# Patient Record
Sex: Female | Born: 1975
Health system: Southern US, Community
[De-identification: ages and names within clinical notes are randomized; demographics above are authoritative.]

---

## 2005-10-08 ENCOUNTER — Ambulatory Visit: Payer: Self-pay | Admitting: Internal Medicine

## 2010-09-08 ENCOUNTER — Ambulatory Visit (INDEPENDENT_AMBULATORY_CARE_PROVIDER_SITE_OTHER): Payer: 59 | Admitting: Family Medicine

## 2010-09-08 ENCOUNTER — Encounter: Payer: Self-pay | Admitting: Family Medicine

## 2010-09-08 VITALS — BP 110/90 | HR 80 | Temp 98.0°F | Resp 12 | Ht 69.75 in | Wt 207.0 lb

## 2010-09-08 DIAGNOSIS — M545 Low back pain: Secondary | ICD-10-CM

## 2010-09-08 MED ORDER — ACETAMINOPHEN-CODEINE 300-30 MG PO TABS: 1.0000 | ORAL_TABLET | ORAL | Status: AC | PRN

## 2010-09-08 MED ORDER — NAPROXEN 500 MG PO TABS: 500.0000 mg | ORAL_TABLET | Freq: Two times a day (BID) | ORAL | Status: DC

## 2010-09-08 MED ORDER — ACETAMINOPHEN-CODEINE 300-30 MG PO TABS: 1.0000 | ORAL_TABLET | ORAL | Status: DC | PRN

## 2010-09-08 NOTE — Patient Instructions (Signed)
Lumbar Radiculopathy, Sciatica Sciatica is a weakness and/or changes in sensation (tingling, jolts, hot and cold, numbness) along the path the sciatic nerve travels. Irritation or damage to lumbar nerve roots is often also referred to as lumbar radiculopathy.  Lumbar radiculopathy (Sciatica) is the most common form of this problem. Radiculopathy can occur in any of the nerves coming out of the spinal cord. The problems caused depend on which nerves are involved. The sciatic nerve is the large nerve supplying the branches of nerves going from the hip to the toes. It often causes a numbness or weakness in the skin and/or muscles that the sciatic nerve serves. It also may cause symptoms (problems) of pain, burning, tingling, or electric shock-like feelings in the path of this nerve. This usually comes from injury to the fibers that make up the sciatic nerve. Some of these symptoms are low back pain and/or unpleasant feelings in the following areas:  From the mid-buttock down the back of the leg to the back of the knee.   And/or the outside of the calf and top of the foot.   And/or behind the inner ankle to the sole of the foot.  CAUSES  Herniated or slipped disc. Discs are the little cushions between the bones in the back.   Pressure by the piriformis muscle in the buttock on the sciatic nerve (Piriformis Syndrome).   Misalignment of the bones in the lower back and buttocks (Sacroiliac Joint Derangement).   Narrowing of the spinal canal that puts pressure on or pinches the fibers that make up the sciatic nerve.   A slipped vertebra that is out of line with those above or beneath it.   Abnormality of the nervous system itself so that nerve fibers do not transmit signals properly, especially to feet and calves (neuropathy).   Tumor (this is rare).  Your caregiver can usually determine the cause of your sciatica and begin the treatment most likely to help you. TREATMENT Taking over-the-counter  painkillers, physical therapy, rest, exercise, spinal manipulation, and injections of anesthetics and/or steroids may be used. Surgery, acupuncture, and Yoga can also be effective. Mind over matter techniques, mental imagery, and changing factors such as your bed, chair, desk height, posture, and activities are other treatments that may be helpful. You and your caregiver can help determine what is best for you. With proper diagnosis, the cause of most sciatica can be identified and removed. Communication and cooperation between your caregiver and you is essential. If you are not successful immediately, do not be discouraged. With time, a proper treatment can be found that will make you comfortable. HOME CARE INSTRUCTIONS  If the pain is coming from a problem in the back, applying ice to that area for 15 minutes, 2-3 times per day while awake, may be helpful. Put the ice in a plastic bag. Place a towel between the bag of ice and your skin.   You may exercise or perform your usual activities if these do not aggravate your pain, or as suggested by your caregiver.   Only take over-the-counter or prescription medicines for pain, discomfort, or fever as directed by your caregiver.   If your caregiver has given you a follow-up appointment, it is very important to keep that appointment. Not keeping the appointment could result in a chronic or permanent injury, pain, and disability. If there is any problem keeping the appointment, you must call back to this facility for assistance.  SEEK IMMEDIATE MEDICAL CARE IF:  You experience loss  of control of bowel or bladder.   You have increasing weakness in the trunk, buttocks, or legs.   There is numbness in any areas from the hip down to the toes.   You have difficulty walking or keeping your balance.   You have any of the above, with fever or forceful vomiting.  Document Released: 05/04/2001 Document Re-Released: 06/01/2009 [facility] Patient Information  2011 [place], [place].

## 2010-09-08 NOTE — Progress Notes (Signed)
  Subjective:    Patient ID: Marie Barr, female    DOB: 1976-03-28, 35 y.o.   MRN: [id number]  HPI Patient is new to establish care. She has unremarkable past history. No known drug allergies.  Takes no regular medications. She is seen with acute issue 2 week history of low back pain. Start mostly in the left buttock and radiates posterior lateral left lower tibia to the knee. Sharp pain 9/10 severity at it's worst. Worse with changing position and sitting. Relieved with standing and sometimes supine. No numbness or weakness. No incontinence symptoms.  Pain progressive over 2 weeks.  Some improvement with Advil. No prior history of back difficulties.   Review of Systems  Constitutional: Negative for fever, chills, fatigue and unexpected weight change.  Gastrointestinal: Negative for nausea, vomiting, abdominal pain, diarrhea, constipation and blood in stool.  Genitourinary: Negative for dysuria and hematuria.  Musculoskeletal: Positive for back pain. Negative for myalgias, joint swelling and gait problem.  Skin: Negative for rash.       Objective:   Physical Exam  Constitutional: She appears well-developed and well-nourished.  Cardiovascular: Normal rate, regular rhythm and normal heart sounds.   No murmur heard. Pulmonary/Chest: Effort normal and breath sounds normal. She has no wheezes. She has no rales.  Musculoskeletal: She exhibits no edema.       Lower lumbar spine nontender. Straight leg raising negative  Neurological:       Patient's full-strength with plantar flexion dorsiflexion bilaterally. DP reflexes 2+ knee bilaterally. She has full ankle reflex on the right trace on the left. Normal symmetric function touch          Assessment & Plan:  Low back pain with radiculopathy symptoms on the left. Trace diminished reflex left compared to right. Naproxen 500 mg twice a day with food and Tylenol with Codeine when necessary for severe pain. She has not tolerated hydrocodone  or oxycodone the past. Touch base 2 weeks if no better. May need MRI to further

## 2010-09-09 ENCOUNTER — Other Ambulatory Visit: Payer: Self-pay | Admitting: *Deleted

## 2010-09-09 ENCOUNTER — Telehealth: Payer: Self-pay | Admitting: Family Medicine

## 2010-09-09 DIAGNOSIS — M545 Low back pain: Secondary | ICD-10-CM

## 2010-09-09 MED ORDER — NAPROXEN 500 MG PO TABS: 500.0000 mg | ORAL_TABLET | Freq: Two times a day (BID) | ORAL | Status: AC

## 2010-09-09 NOTE — Telephone Encounter (Signed)
Pt called and said that the script for Naproxen was never rcvd by [facility] on [address]. Pls resend asap today.

## 2010-10-21 ENCOUNTER — Ambulatory Visit (INDEPENDENT_AMBULATORY_CARE_PROVIDER_SITE_OTHER): Payer: 59 | Admitting: Family Medicine

## 2010-10-21 ENCOUNTER — Encounter: Payer: Self-pay | Admitting: Family Medicine

## 2010-10-21 VITALS — BP 110/74 | Temp 98.4°F | Wt 206.0 lb

## 2010-10-21 DIAGNOSIS — M545 Low back pain: Secondary | ICD-10-CM

## 2010-10-21 NOTE — Progress Notes (Addendum)
  Subjective:    Patient ID: Marie Barr, female    DOB: [birth year], 35 y.o.   MRN: [id number]  HPI Followup low back pain with left sciatica symptoms. Symptoms not improved and possibly worse. Pain now radiating occasionally all the way to mid calf. No numbness. No weakness. No reported injury. Has been to chiropractor since seen here and treated couple of times without improvement. She has taken Naprosyn without relief. No incontinence symptoms. Pain still remains severe at times. Worse with position change   Review of Systems  Constitutional: Negative for fever, chills, appetite change, fatigue and unexpected weight change.  Genitourinary: Negative for dysuria.  Musculoskeletal: Positive for back pain. Negative for gait problem.  Hematological: Negative for adenopathy. Does not bruise/bleed easily.       Objective:   Physical Exam  Constitutional: She appears well-developed and well-nourished.  Cardiovascular: Normal rate, regular rhythm and normal heart sounds.   Pulmonary/Chest: Effort normal and breath sounds normal. No respiratory distress. She has no wheezes. She has no rales.  Musculoskeletal:       Straight leg raise positive on the left. No left lower extremity edema  Neurological:       Full strength with plantar flexion dorsi flexion. Sensory intact. Diminished deep tendon reflex left ankle compared to right and slightly diminished left knee compared to right          Assessment & Plan:  Lumbar back pain with left lower extremity radiculopathy symptoms and diminished reflexes as above. Start with lumbosacral spine films. Will likely need MRI to further assess given progressive symptoms over 6 weeks not responding to conservative therapy  Abnormal MRI-see result note.  Refer neurosurgery.

## 2010-10-22 ENCOUNTER — Ambulatory Visit (INDEPENDENT_AMBULATORY_CARE_PROVIDER_SITE_OTHER)
Admission: RE | Admit: 2010-10-22 | Discharge: 2010-10-22 | Disposition: A | Discharge status: Home / Self Care | Payer: 59 | Source: Ambulatory Visit | Attending: Family Medicine | Admitting: Family Medicine

## 2010-10-22 DIAGNOSIS — M545 Low back pain: Secondary | ICD-10-CM

## 2010-10-23 ENCOUNTER — Telehealth: Payer: Self-pay | Admitting: *Deleted

## 2010-10-23 DIAGNOSIS — M545 Low back pain, unspecified: Secondary | ICD-10-CM

## 2010-10-23 NOTE — Telephone Encounter (Signed)
MRI lumbar spine order completed

## 2010-10-29 ENCOUNTER — Ambulatory Visit
Admission: RE | Admit: 2010-10-29 | Discharge: 2010-10-29 | Disposition: A | Discharge status: Home / Self Care | Payer: 59 | Source: Ambulatory Visit | Attending: Family Medicine | Admitting: Family Medicine

## 2010-10-29 DIAGNOSIS — M545 Low back pain: Secondary | ICD-10-CM

## 2010-10-29 NOTE — Progress Notes (Signed)
Addended by: Kristian Covey on: 10/29/2010 05:35 PM   Modules accepted: Orders

## 2010-10-29 NOTE — Progress Notes (Deleted)
  Subjective:    Patient ID: Marie Barr, female    DOB: 02/26/1976, 35 y.o.   MRN: [id number]  HPI    Review of Systems     Objective:   Physical Exam        Assessment & Plan:

## 2010-10-29 NOTE — Progress Notes (Signed)
Quick Note:  Have you seen this yet? ______

## 2010-11-04 ENCOUNTER — Other Ambulatory Visit (INDEPENDENT_AMBULATORY_CARE_PROVIDER_SITE_OTHER): Payer: 59

## 2010-11-04 DIAGNOSIS — Z0000 Encounter for general adult medical examination without abnormal findings: Secondary | ICD-10-CM

## 2010-11-04 LAB — BASIC METABOLIC PANEL
CO2: 28 mEq/L (ref 19–32)
Calcium: 9.2 mg/dL (ref 8.4–10.5)
Chloride: 106 mEq/L (ref 96–112)
Glucose, Bld: 97 mg/dL (ref 70–99)
Potassium: 4.8 mEq/L (ref 3.5–5.1)
Sodium: 142 mEq/L (ref 135–145)

## 2010-11-04 LAB — CBC WITH DIFFERENTIAL/PLATELET
Basophils Relative: 0.6 % (ref 0.0–3.0)
Eosinophils Absolute: 0.4 10*3/uL (ref 0.0–0.7)
HCT: 39 % (ref 36.0–46.0)
Hemoglobin: 13.4 g/dL (ref 12.0–15.0)
Lymphocytes Relative: 22.9 % (ref 12.0–46.0)
Lymphs Abs: 1.7 10*3/uL (ref 0.7–4.0)
MCHC: 34.2 g/dL (ref 30.0–36.0)
Neutro Abs: 4.4 10*3/uL (ref 1.4–7.7)
RBC: 4.38 Mil/uL (ref 3.87–5.11)

## 2010-11-04 LAB — LIPID PANEL
Cholesterol: 178 mg/dL (ref 0–200)
HDL: 33.3 mg/dL — ABNORMAL LOW (ref >39.00)

## 2010-11-04 LAB — HEPATIC FUNCTION PANEL
AST: 20 U/L (ref 0–37)
Albumin: 4.3 g/dL (ref 3.5–5.2)
Alkaline Phosphatase: 74 U/L (ref 39–117)
Total Protein: 7.1 g/dL (ref 6.0–8.3)

## 2010-11-04 LAB — TSH: TSH: 1.44 u[IU]/mL (ref 0.35–5.50)

## 2010-11-04 LAB — POCT URINALYSIS DIPSTICK
Bilirubin, UA: NEGATIVE
Ketones, UA: NEGATIVE
Leukocytes, UA: NEGATIVE
Spec Grav, UA: 1.02

## 2010-11-16 HISTORY — PX: SPINE SURGERY: SHX786

## 2010-11-17 ENCOUNTER — Encounter: Payer: 59 | Admitting: Family Medicine

## 2011-12-29 ENCOUNTER — Other Ambulatory Visit (INDEPENDENT_AMBULATORY_CARE_PROVIDER_SITE_OTHER): Payer: 59

## 2011-12-29 DIAGNOSIS — Z0000 Encounter for general adult medical examination without abnormal findings: Secondary | ICD-10-CM

## 2011-12-29 LAB — POCT URINALYSIS DIPSTICK
Bilirubin, UA: NEGATIVE
Ketones, UA: NEGATIVE
Nitrite, UA: NEGATIVE
Protein, UA: NEGATIVE
Urobilinogen, UA: 0.2

## 2011-12-29 LAB — CBC WITH DIFFERENTIAL/PLATELET
Basophils Relative: 0.4 % (ref 0.0–3.0)
Eosinophils Relative: 2.7 % (ref 0.0–5.0)
HCT: 39 % (ref 36.0–46.0)
Hemoglobin: 13.1 g/dL (ref 12.0–15.0)
Lymphs Abs: 1.7 10*3/uL (ref 0.7–4.0)
MCV: 87.5 fl (ref 78.0–100.0)
Monocytes Absolute: 0.6 10*3/uL (ref 0.1–1.0)
Neutro Abs: 5.1 10*3/uL (ref 1.4–7.7)
Neutrophils Relative %: 67.2 % (ref 43.0–77.0)
RBC: 4.45 Mil/uL (ref 3.87–5.11)
WBC: 7.6 10*3/uL (ref 4.5–10.5)

## 2011-12-29 LAB — HEPATIC FUNCTION PANEL
ALT: 12 U/L (ref 0–35)
Bilirubin, Direct: 0.1 mg/dL (ref 0.0–0.3)
Total Protein: 6.8 g/dL (ref 6.0–8.3)

## 2011-12-29 LAB — BASIC METABOLIC PANEL
Chloride: 108 mEq/L (ref 96–112)
Potassium: 3.7 mEq/L (ref 3.5–5.1)
Sodium: 139 mEq/L (ref 135–145)

## 2011-12-29 LAB — LIPID PANEL
Total CHOL/HDL Ratio: 5
Triglycerides: 73 mg/dL (ref 0.0–149.0)

## 2012-01-05 ENCOUNTER — Ambulatory Visit (INDEPENDENT_AMBULATORY_CARE_PROVIDER_SITE_OTHER): Payer: 59 | Admitting: Family Medicine

## 2012-01-05 ENCOUNTER — Encounter: Payer: Self-pay | Admitting: Family Medicine

## 2012-01-05 VITALS — BP 120/72 | HR 72 | Temp 97.8°F | Resp 12 | Ht 69.75 in | Wt 209.0 lb

## 2012-01-05 DIAGNOSIS — Z0000 Encounter for general adult medical examination without abnormal findings: Secondary | ICD-10-CM

## 2012-01-05 MED ORDER — ETONOGESTREL-ETHINYL ESTRADIOL 0.12-0.015 MG/24HR VA RING: VAGINAL_RING | VAGINAL | Status: DC

## 2012-01-05 NOTE — Patient Instructions (Addendum)
Check on dates for last tetanus. Reschedule for pap smear.

## 2012-01-05 NOTE — Progress Notes (Signed)
  Subjective:    Patient ID: Marie Barr, female    DOB: 1975-06-19, 36 y.o.   MRN: [id number]  HPI  Patient seen for complete physical. She had back surgery for L5-S1 discectomy last summer and has done extremely well since then. She's recently started exercise program. She does have somewhat irregular menses. They occur monthly but sometimes had prolonged spotting. Previously, she has used Nuva ring with good success would like to consider going back on that. Her periods were more regulated on that.  She is nonsmoker.  Last tetanus she estimates 8 years ago. She is currently on menses and wishes to avoid Pap smear today.  No past medical history on file. Past Surgical History  Procedure Date  . Spine surgery 11-16-10    L5-S1 discetomy    reports that she has never smoked. She does not have any smokeless tobacco history on file. Her alcohol and drug histories not on file. family history includes Arthritis in her mother; Hypertension in her father and mother; and Stroke in her mother. Not on File    Review of Systems  Constitutional: Negative for fever, activity change, appetite change, fatigue and unexpected weight change.  HENT: Negative for hearing loss, ear pain, sore throat and trouble swallowing.   Eyes: Negative for visual disturbance.  Respiratory: Negative for cough and shortness of breath.   Cardiovascular: Negative for chest pain and palpitations.  Gastrointestinal: Negative for abdominal pain, diarrhea, constipation and blood in stool.  Genitourinary: Negative for dysuria and hematuria.  Musculoskeletal: Negative for myalgias, back pain and arthralgias.  Skin: Negative for rash.  Neurological: Negative for dizziness, syncope and headaches.  Hematological: Negative for adenopathy.  Psychiatric/Behavioral: Negative for confusion and dysphoric mood.       Objective:   Physical Exam  Constitutional: She is oriented to person, place, and time. She appears well-developed  and well-nourished.  HENT:  Head: Normocephalic and atraumatic.  Eyes: EOM are normal. Pupils are equal, round, and reactive to light.  Neck: Normal range of motion. Neck supple. No thyromegaly present.  Cardiovascular: Normal rate, regular rhythm and normal heart sounds.   No murmur heard. Pulmonary/Chest: Breath sounds normal. No respiratory distress. She has no wheezes. She has no rales.  Abdominal: Soft. Bowel sounds are normal. She exhibits no distension and no mass. There is no tenderness. There is no rebound and no guarding.  Musculoskeletal: Normal range of motion. She exhibits no edema.  Lymphadenopathy:    She has no cervical adenopathy.  Neurological: She is alert and oriented to person, place, and time. She displays normal reflexes. No cranial nerve deficit.  Skin: No rash noted.  Psychiatric: She has a normal mood and affect. Her behavior is normal. Judgment and thought content normal.          Assessment & Plan:  Complete physical. Labs reviewed with patient. She has low HDL otherwise no significant abnormalities. Check on date of last tetanus. Nuva ring prescribed. She is aware of instructions for use. She will return for Pap smear when not on heavy menses. Work on weight loss.

## 2013-01-05 ENCOUNTER — Other Ambulatory Visit: Payer: Self-pay

## 2013-01-05 MED ORDER — ETONOGESTREL-ETHINYL ESTRADIOL 0.12-0.015 MG/24HR VA RING: VAGINAL_RING | VAGINAL | Status: AC

## 2013-03-01 ENCOUNTER — Other Ambulatory Visit (HOSPITAL_COMMUNITY): Payer: Self-pay | Admitting: Endocrinology

## 2013-03-01 DIAGNOSIS — E0590 Thyrotoxicosis, unspecified without thyrotoxic crisis or storm: Secondary | ICD-10-CM

## 2013-03-20 ENCOUNTER — Encounter (HOSPITAL_COMMUNITY)
Admission: RE | Admit: 2013-03-20 | Discharge: 2013-03-20 | Disposition: A | Discharge status: Home / Self Care | Payer: 59 | Source: Ambulatory Visit | Attending: Endocrinology | Admitting: Endocrinology

## 2013-03-20 DIAGNOSIS — E0590 Thyrotoxicosis, unspecified without thyrotoxic crisis or storm: Secondary | ICD-10-CM | POA: Insufficient documentation

## 2013-03-21 ENCOUNTER — Encounter (HOSPITAL_COMMUNITY)
Admission: RE | Admit: 2013-03-21 | Discharge: 2013-03-21 | Disposition: A | Discharge status: Home / Self Care | Payer: 59 | Source: Ambulatory Visit | Attending: Endocrinology | Admitting: Endocrinology

## 2013-03-21 MED ORDER — SODIUM PERTECHNETATE TC 99M INJECTION
10.7000 | Freq: Once | INTRAVENOUS | Status: AC | PRN
  Administered 2013-03-21: 11 via INTRAVENOUS

## 2013-03-21 MED ORDER — SODIUM IODIDE I 131 CAPSULE
10.0000 | Freq: Once | INTRAVENOUS | Status: AC | PRN
  Administered 2013-03-21: 10 via ORAL

## 2013-03-30 ENCOUNTER — Other Ambulatory Visit: Payer: Self-pay | Admitting: Endocrinology

## 2013-03-30 DIAGNOSIS — E0590 Thyrotoxicosis, unspecified without thyrotoxic crisis or storm: Secondary | ICD-10-CM

## 2013-04-04 ENCOUNTER — Ambulatory Visit
Admission: RE | Admit: 2013-04-04 | Discharge: 2013-04-04 | Disposition: A | Discharge status: Home / Self Care | Payer: 59 | Source: Ambulatory Visit | Attending: Endocrinology | Admitting: Endocrinology

## 2013-04-04 DIAGNOSIS — E0590 Thyrotoxicosis, unspecified without thyrotoxic crisis or storm: Secondary | ICD-10-CM

## 2013-04-06 ENCOUNTER — Other Ambulatory Visit (HOSPITAL_COMMUNITY): Payer: Self-pay | Admitting: Endocrinology

## 2013-04-06 DIAGNOSIS — E0590 Thyrotoxicosis, unspecified without thyrotoxic crisis or storm: Secondary | ICD-10-CM

## 2013-04-20 ENCOUNTER — Encounter (HOSPITAL_COMMUNITY)
Admission: RE | Admit: 2013-04-20 | Discharge: 2013-04-20 | Disposition: A | Discharge status: Home / Self Care | Payer: 59 | Source: Ambulatory Visit | Attending: Endocrinology | Admitting: Endocrinology

## 2013-04-24 ENCOUNTER — Encounter (HOSPITAL_COMMUNITY)
Admission: RE | Admit: 2013-04-24 | Discharge: 2013-04-24 | Disposition: A | Discharge status: Home / Self Care | Payer: 59 | Source: Ambulatory Visit | Attending: Endocrinology | Admitting: Endocrinology

## 2013-04-24 DIAGNOSIS — E0590 Thyrotoxicosis, unspecified without thyrotoxic crisis or storm: Secondary | ICD-10-CM | POA: Insufficient documentation

## 2013-04-24 LAB — HCG, SERUM, QUALITATIVE: Preg, Serum: NEGATIVE

## 2013-04-24 MED ORDER — SODIUM IODIDE I 131 CAPSULE
28.8000 | Freq: Once | INTRAVENOUS | Status: AC | PRN
  Administered 2013-04-24: 28.8 via ORAL

## 2013-08-07 ENCOUNTER — Other Ambulatory Visit (HOSPITAL_COMMUNITY)
Admission: RE | Admit: 2013-08-07 | Discharge: 2013-08-07 | Disposition: A | Discharge status: Home / Self Care | Payer: 59 | Source: Ambulatory Visit | Attending: Family Medicine | Admitting: Family Medicine

## 2013-08-07 ENCOUNTER — Other Ambulatory Visit: Payer: Self-pay | Admitting: Family Medicine

## 2013-08-07 DIAGNOSIS — Z1151 Encounter for screening for human papillomavirus (HPV): Secondary | ICD-10-CM | POA: Insufficient documentation

## 2013-08-07 DIAGNOSIS — Z124 Encounter for screening for malignant neoplasm of cervix: Secondary | ICD-10-CM | POA: Insufficient documentation

## 2016-09-09 ENCOUNTER — Other Ambulatory Visit (HOSPITAL_COMMUNITY)
Admission: RE | Admit: 2016-09-09 | Discharge: 2016-09-09 | Disposition: A | Discharge status: Home / Self Care | Payer: 59 | Source: Ambulatory Visit | Attending: Family Medicine | Admitting: Family Medicine

## 2016-09-09 ENCOUNTER — Other Ambulatory Visit: Payer: Self-pay | Admitting: Family Medicine

## 2016-09-09 DIAGNOSIS — Z01411 Encounter for gynecological examination (general) (routine) with abnormal findings: Secondary | ICD-10-CM | POA: Diagnosis present

## 2016-09-09 DIAGNOSIS — Z1151 Encounter for screening for human papillomavirus (HPV): Secondary | ICD-10-CM | POA: Diagnosis present

## 2016-09-14 LAB — CYTOLOGY - PAP
DIAGNOSIS: NEGATIVE
HPV: NOT DETECTED

## 2017-04-11 ENCOUNTER — Ambulatory Visit (INDEPENDENT_AMBULATORY_CARE_PROVIDER_SITE_OTHER): Payer: 59

## 2017-04-11 ENCOUNTER — Ambulatory Visit (INDEPENDENT_AMBULATORY_CARE_PROVIDER_SITE_OTHER): Payer: 59 | Admitting: Orthopedic Surgery

## 2017-04-11 ENCOUNTER — Encounter (INDEPENDENT_AMBULATORY_CARE_PROVIDER_SITE_OTHER): Payer: Self-pay | Admitting: Orthopedic Surgery

## 2017-04-11 DIAGNOSIS — M25561 Pain in right knee: Secondary | ICD-10-CM

## 2017-04-11 MED ORDER — METHYLPREDNISOLONE ACETATE 40 MG/ML IJ SUSP
40.0000 mg | INTRAMUSCULAR | Status: AC | PRN
  Administered 2017-04-11: 40 mg via INTRA_ARTICULAR

## 2017-04-11 MED ORDER — LIDOCAINE HCL 1 % IJ SOLN
5.0000 mL | INTRAMUSCULAR | Status: AC | PRN
  Administered 2017-04-11: 5 mL

## 2017-04-11 MED ORDER — BUPIVACAINE HCL 0.25 % IJ SOLN
4.0000 mL | INTRAMUSCULAR | Status: AC | PRN
  Administered 2017-04-11: 4 mL via INTRA_ARTICULAR

## 2017-04-11 NOTE — Progress Notes (Signed)
Office Visit Note   Patient: Marie Barr           Date of Birth: 09/22/1975           MRN: [id number] Visit Date: 04/11/2017 Requested by: Carilyn GoodpastureWillard, Jennifer, PA-C [address] [place], [place] [address] PCP: Trey SailorsPa, [facility]  Subjective: Chief Complaint  Patient presents with  . Right Knee - Pain    HPI: Marie DroneBrenda is a 41 year old patient with right knee pain.  She has a long history of right knee pain and it is been swelling some over the past 6 weeks but more over the past 3 weeks.  Reports some weakness and giving way.  Symptoms are worse when she is going up and down stairs.  She has a brace which she wears but it doesn't help much.  She works [employer] which involves a lot of standing.  It's.  Difficult for her to squat.  She has taken some Advil which has given her only marginal relief.  Localizes the pain primarily to the lateral aspect of the knee              ROS: All systems reviewed are negative as they relate to the chief complaint within the history of present illness.  Patient denies  fevers or chills.   Assessment & Plan: Visit Diagnoses:  1. Acute pain of right knee     Plan: Impression is right knee pain with mild effusion lateral joint line tenderness possible degenerative meniscal tear versus early nonunion radiographic osteoarthritis.  Plan aspiration and injection of the knees done today.  Needs MRI right knee to evaluate for lateral meniscal tear.  I'll see her back after that study  Follow-Up Instructions: Return for after MRI.   Orders:  Orders Placed This Encounter  Procedures  . XR KNEE 3 VIEW RIGHT  . MR Knee Right w/o contrast   No orders of the defined types were placed in this encounter.     Procedures: Large Joint Inj: R knee on 04/11/2017 1:52 PM Indications: diagnostic evaluation, joint swelling and pain Details: 18 G 1.5 in needle, superolateral approach  Arthrogram: No  Medications: 5 mL lidocaine 1  %; 40 mg methylPREDNISolone acetate 40 MG/ML; 4 mL bupivacaine 0.25 % Outcome: tolerated well, no immediate complications Procedure, treatment alternatives, risks and benefits explained, specific risks discussed. Consent was given by the patient. Immediately prior to procedure a time out was called to verify the correct patient, procedure, equipment, support staff and site/side marked as required. Patient was prepped and draped in the usual sterile fashion.       Clinical Data: No additional findings.  Objective: Vital Signs: There were no vitals taken for this visit.  Physical Exam:   Constitutional: Patient appears well-developed HEENT:  Head: Normocephalic Eyes:EOM are normal Neck: Normal range of motion Cardiovascular: Normal rate Pulmonary/chest: Effort normal Neurologic: Patient is alert Skin: Skin is warm Psychiatric: Patient has normal mood and affect    Ortho Exam: Orthopedic exam demonstrates full active and passive range of motion of the right knee with trace effusion lateral joint line tenderness stable collateral crucial ligaments intact extensor mechanism palpable pedal pulses no other groin pain with internal/external rotation of the leg.  She does have some focal lateral joint line tenderness but no actual patellar tendon tenderness.  Patella itself is stable with no apprehension  Specialty Comments:  No specialty comments available.  Imaging: Xr Knee 3 View Right  Result Date: 04/11/2017  AP lateral merchant right knee reviewed.  There is early spurring in the lateral compartment as well as on the proximal and distal aspect of the patellar tendon.  No effusion is present.  No fractures present.    PMFS History: There are no active problems to display for this patient.  No past medical history on file.  Family History  Problem Relation Age of Onset  . Arthritis Mother   . Hypertension Mother   . Stroke Mother        two  . Hypertension Father       Past Surgical History:  Procedure Laterality Date  . SPINE SURGERY  11-16-10   L5-S1 discetomy   Social History   Occupational History  . Not on file  Tobacco Use  . Smoking status: Never Smoker  . Smokeless tobacco: Never Used  Substance and Sexual Activity  . Alcohol use: Not on file  . Drug use: Not on file  . Sexual activity: Not on file

## 2017-04-13 ENCOUNTER — Ambulatory Visit (INDEPENDENT_AMBULATORY_CARE_PROVIDER_SITE_OTHER): Payer: Self-pay | Admitting: Orthopedic Surgery

## 2017-06-07 ENCOUNTER — Other Ambulatory Visit: Payer: Self-pay | Admitting: Family Medicine

## 2017-06-07 DIAGNOSIS — Z1231 Encounter for screening mammogram for malignant neoplasm of breast: Secondary | ICD-10-CM

## 2017-06-15 ENCOUNTER — Ambulatory Visit (INDEPENDENT_AMBULATORY_CARE_PROVIDER_SITE_OTHER): Payer: 59

## 2017-06-15 ENCOUNTER — Encounter (INDEPENDENT_AMBULATORY_CARE_PROVIDER_SITE_OTHER): Payer: Self-pay | Admitting: Orthopedic Surgery

## 2017-06-15 ENCOUNTER — Ambulatory Visit (INDEPENDENT_AMBULATORY_CARE_PROVIDER_SITE_OTHER): Payer: 59 | Admitting: Orthopedic Surgery

## 2017-06-15 DIAGNOSIS — M25562 Pain in left knee: Secondary | ICD-10-CM

## 2017-06-15 DIAGNOSIS — M545 Low back pain, unspecified: Secondary | ICD-10-CM

## 2017-06-15 DIAGNOSIS — G8929 Other chronic pain: Secondary | ICD-10-CM | POA: Diagnosis not present

## 2017-06-15 DIAGNOSIS — M25511 Pain in right shoulder: Secondary | ICD-10-CM

## 2017-06-15 MED ORDER — METHOCARBAMOL 500 MG PO TABS: 500.0000 mg | ORAL_TABLET | Freq: Three times a day (TID) | ORAL | 0 refills | Status: AC | PRN

## 2017-06-18 ENCOUNTER — Encounter (INDEPENDENT_AMBULATORY_CARE_PROVIDER_SITE_OTHER): Payer: Self-pay | Admitting: Orthopedic Surgery

## 2017-06-18 DIAGNOSIS — M25562 Pain in left knee: Secondary | ICD-10-CM

## 2017-06-18 DIAGNOSIS — G8929 Other chronic pain: Secondary | ICD-10-CM | POA: Diagnosis not present

## 2017-06-18 MED ORDER — METHYLPREDNISOLONE ACETATE 40 MG/ML IJ SUSP
40.0000 mg | INTRAMUSCULAR | Status: AC | PRN
  Administered 2017-06-18: 40 mg via INTRA_ARTICULAR

## 2017-06-18 MED ORDER — BUPIVACAINE HCL 0.25 % IJ SOLN
4.0000 mL | INTRAMUSCULAR | Status: AC | PRN
  Administered 2017-06-18: 4 mL via INTRA_ARTICULAR

## 2017-06-18 MED ORDER — LIDOCAINE HCL 1 % IJ SOLN
5.0000 mL | INTRAMUSCULAR | Status: AC | PRN
  Administered 2017-06-18: 5 mL

## 2017-06-18 NOTE — Progress Notes (Signed)
Office Visit Note   Patient: Marie Barr           Date of Birth: 22-Dec-1975           MRN: [id number] Visit Date: 06/15/2017 Requested by: [facility] [address] [place], [place] [address] PCP: [facility]  Subjective: Chief Complaint  Patient presents with  . Right Shoulder - Pain  . Lower Back - Pain  . Left Knee - Pain    HPI: Marie Barr is a patient with right shoulder pain left knee pain and low back pain.  In regards to the right shoulder pain she reports significant pain under the shoulder blade.  Has increased pain with repeat motion.  Her pain is in the area of the rhomboid attachment.  Her symptoms are on and off.  When they are on she really has a tough time relieving the pain.  Stretching does not help.  The pain does not radiate down the arm.  She denies any neck pain.  Sleeps on her left hand side.  Patient also reports left knee pain.  She had a successful injection into the right knee.  States that on the left knee going up and down stairs is painful.  She is unable to lay her legs on top of each other the couch.  Patient also reports chronic low back pain.  She is unable to forward flex and bend without pain.  States that the pain localizes within her lower back with no radiation or numbness and tingling.  Cannot really walk in the morning due to stiffness.  She does have a history of lumbar surgery 8 years ago for a ruptured disc.  States this pain is not quite the same as her ruptured disc pain.  She is currently on a prednisone taper which is helping only marginally.              ROS: All systems reviewed are negative as they relate to the chief complaint within the history of present illness.  Patient denies  fevers or chills.   Assessment & Plan: Visit Diagnoses:  1. Chronic midline low back pain without sciatica   2. Chronic right shoulder pain   3. Chronic pain of left knee     Plan: Impression  is right shoulder pain related to the shoulder blade which does not sound like it is referred pain from the neck.  Her shoulder exam is normal.  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  We will get MRI scan of that scapula to evaluate for muscle detachment.  The symptoms in the shoulder have been ongoing for over 6 months.  Regard to the left knee I think this likely represents patellofemoral arthritis.  An injection helped the right knee and we will try that for the left knee.  In regards to the chronic low back pain she has a history of surgery in the lower back.  I think she likely has some type of recurrent central disc based on the amount of disability she is having.  This is been going on now for 2-3 months and it is worsening.  I think she needs MRI scan of the lumbar spine to evaluate for recurrent HNP with subsequent ESI to follow.  We will also add Robaxin as a muscle relaxer to her pain regimen.  Follow-Up Instructions: Return for after MRI.   Orders:  Orders  Placed This Encounter  Procedures  . XR Lumbar Spine 2-3 Views  . XR KNEE 3 VIEW LEFT  . XR Shoulder Right  . MR Lumbar Spine w/o contrast  . MR SHOULDER RIGHT WO CONTRAST   Meds ordered this encounter  Medications  . methocarbamol (ROBAXIN) 500 MG tablet    Sig: Take 1 tablet (500 mg total) by mouth every 8 (eight) hours as needed for muscle spasms.    Dispense:  30 tablet    Refill:  0      Procedures: Large Joint Inj: L knee on 06/18/2017 8:44 AM Indications: diagnostic evaluation, joint swelling and pain Details: 18 G 1.5 in needle, superolateral approach  Arthrogram: No  Medications: 5 mL lidocaine 1 %; 40 mg methylPREDNISolone acetate 40 MG/ML; 4 mL bupivacaine 0.25 % Outcome: tolerated well, no immediate complications Procedure, treatment alternatives, risks and benefits explained, specific risks discussed. Consent was given by the patient.  Immediately prior to procedure a time out was called to verify the correct patient, procedure, equipment, support staff and site/side marked as required. Patient was prepped and draped in the usual sterile fashion.       Clinical Data: No additional findings.  Objective: Vital Signs: There were no vitals taken for this visit.  Physical Exam:   Constitutional: Patient appears well-developed HEENT:  Head: Normocephalic Eyes:EOM are normal Neck: Normal range of motion Cardiovascular: Normal rate Pulmonary/chest: Effort normal Neurologic: Patient is alert Skin: Skin is warm Psychiatric: Patient has normal mood and affect    Ortho Exam: Orthopedic examination of the right shoulder demonstrates full active forward flexion abduction and external rotation.  Rotator cuff strength intact to isolated infraspinatus supraspinatus and subscap muscle testing.  No scapular dyskinesia with forward flexion.  She does have tenderness along the medial border of the scapula.  Negative O'Brien's testing on the right.  No AC joint tenderness on the right.  No other masses lymphadenopathy or skin changes noted in the shoulder girdle region.  Examination of the left knee demonstrates no effusion but patellofemoral crepitus.  Collateral and cruciate ligaments are stable.  Extensor mechanism is intact.  No other masses lymphadenopathy or skin changes noted in the left knee region.  Patient has well-healed surgical incision on her back.  She has pain with forward and lateral bending.  No nerve root tension signs.  Symmetric reflexes bilateral patella and Achilles.  No muscle atrophy in the legs.  No definite paresthesias in either leg.  Specialty Comments:  No specialty comments available.  Imaging: No results found.   PMFS History: There are no active problems to display for this patient.  History reviewed. No pertinent past medical history.  Family History  Problem Relation Age of Onset  .  Arthritis Mother   . Hypertension Mother   . Stroke Mother        two  . Hypertension Father     Past Surgical History:  Procedure Laterality Date  . SPINE SURGERY  11-16-10   L5-S1 discetomy   Social History   Occupational History  . Not on file  Tobacco Use  . Smoking status: Never Smoker  . Smokeless tobacco: Never Used  Substance and Sexual Activity  . Alcohol use: Not on file  . Drug use: Not on file  . Sexual activity: Not on file

## 2017-06-26 ENCOUNTER — Ambulatory Visit
Admission: RE | Admit: 2017-06-26 | Discharge: 2017-06-26 | Disposition: A | Discharge status: Home / Self Care | Payer: 59 | Source: Ambulatory Visit | Attending: Orthopedic Surgery | Admitting: Orthopedic Surgery

## 2017-06-26 DIAGNOSIS — M25511 Pain in right shoulder: Principal | ICD-10-CM

## 2017-06-26 DIAGNOSIS — M545 Low back pain, unspecified: Secondary | ICD-10-CM

## 2017-06-26 DIAGNOSIS — G8929 Other chronic pain: Secondary | ICD-10-CM

## 2017-06-28 ENCOUNTER — Ambulatory Visit
Admission: RE | Admit: 2017-06-28 | Discharge: 2017-06-28 | Disposition: A | Discharge status: Home / Self Care | Payer: 59 | Source: Ambulatory Visit | Attending: Family Medicine | Admitting: Family Medicine

## 2017-06-28 DIAGNOSIS — Z1231 Encounter for screening mammogram for malignant neoplasm of breast: Secondary | ICD-10-CM

## 2017-07-11 ENCOUNTER — Ambulatory Visit (INDEPENDENT_AMBULATORY_CARE_PROVIDER_SITE_OTHER): Payer: 59 | Admitting: Orthopedic Surgery

## 2017-07-11 ENCOUNTER — Encounter (INDEPENDENT_AMBULATORY_CARE_PROVIDER_SITE_OTHER): Payer: Self-pay | Admitting: Orthopedic Surgery

## 2017-07-11 DIAGNOSIS — G8929 Other chronic pain: Secondary | ICD-10-CM | POA: Diagnosis not present

## 2017-07-11 DIAGNOSIS — M5440 Lumbago with sciatica, unspecified side: Secondary | ICD-10-CM

## 2017-07-13 ENCOUNTER — Encounter (INDEPENDENT_AMBULATORY_CARE_PROVIDER_SITE_OTHER): Payer: Self-pay | Admitting: Orthopedic Surgery

## 2017-07-13 NOTE — Progress Notes (Signed)
Office Visit Note   Patient: Marie Barr           Date of Birth: 02/03/1976           MRN: [id number] Visit Date: 07/11/2017 Requested by: Trey SailorsPa, [facility] [address] [place], [place] [address] PCP: Trey SailorsPa, [facility]  Subjective: Chief Complaint  Patient presents with  . Right Shoulder - Follow-up  . Lower Back - Follow-up    HPI: Marie Barr is a patient with right shoulder blade pain and low back pain.  Since I have seen her she has had MRI scan of the shoulder and scapula along with the lumbar spine.  Does have a history of disc surgery 8 years ago.  She localizes pain discretely across the lower back with no radiation.  Her shoulder blade is quite tender at the medial border along the mid section of the scapula.  She denies any real deltoid type bursitis type pain.              ROS: All systems reviewed are negative as they relate to the chief complaint within the history of present illness.  Patient denies  fevers or chills.   Assessment & Plan: Visit Diagnoses:  1. Chronic midline low back pain with sciatica, sciatica laterality unspecified     Plan: Impression is shoulder blade pain and lumbar spine pain.  She does have some edema in the serratus anterior which could explain some of her symptoms.  We discussed an injection today but that would carry the risk of pneumothorax based on where she is tender and where she would like the injection to go.  As essentially underneath the shoulder blade.  We will hold off on that for now and instead do seated rows and crossed arm adduction stretching.  In regards to the back that is is more difficult problem.  MRI scan on that shows fairly significant L5-S1 degenerative disc disease as well as L4-5 facet arthritis.  This is very focal in the rest of her back looks good.  She may be a candidate for facet joint injections and possible radiofrequency ablation.  We will send her to Dr. Alvester MorinNewton  for evaluation for that.  I will see her back as needed.  Follow-Up Instructions: Return if symptoms worsen or fail to improve.   Orders:  Orders Placed This Encounter  Procedures  . Ambulatory referral to Physical Medicine Rehab   No orders of the defined types were placed in this encounter.     Procedures: No procedures performed   Clinical Data: No additional findings.  Objective: Vital Signs: There were no vitals taken for this visit.  Physical Exam:   Constitutional: Patient appears well-developed HEENT:  Head: Normocephalic Eyes:EOM are normal Neck: Normal range of motion Cardiovascular: Normal rate Pulmonary/chest: Effort normal Neurologic: Patient is alert Skin: Skin is warm Psychiatric: Patient has normal mood and affect    Ortho Exam: Orthopedic examination is generally unchanged.  Shoulder range of motion is good on the right with good rotator cuff strength.  She does have tenderness along the medial border of the scapula but with not much in the way of scapulothoracic crepitus.  No nerve root tension signs in the back but definite some pain with forward and lateral bending which is primarily around the posterior superior iliac crest region.  Specialty Comments:  No specialty comments available.  Imaging: No results found.   PMFS History: There are no active problems to  display for this patient.  History reviewed. No pertinent past medical history.  Family History  Problem Relation Age of Onset  . Arthritis Mother   . Hypertension Mother   . Stroke Mother        two  . Hypertension Father     Past Surgical History:  Procedure Laterality Date  . SPINE SURGERY  11-16-10   L5-S1 discetomy   Social History   Occupational History  . Not on file  Tobacco Use  . Smoking status: Never Smoker  . Smokeless tobacco: Never Used  Substance and Sexual Activity  . Alcohol use: Not on file  . Drug use: Not on file  . Sexual activity: Not on file

## 2017-07-28 ENCOUNTER — Ambulatory Visit (INDEPENDENT_AMBULATORY_CARE_PROVIDER_SITE_OTHER): Payer: Self-pay | Admitting: Physical Medicine and Rehabilitation

## 2017-08-04 ENCOUNTER — Ambulatory Visit (INDEPENDENT_AMBULATORY_CARE_PROVIDER_SITE_OTHER): Payer: 59 | Admitting: Physical Medicine and Rehabilitation

## 2017-08-04 ENCOUNTER — Encounter (INDEPENDENT_AMBULATORY_CARE_PROVIDER_SITE_OTHER): Payer: Self-pay | Admitting: Physical Medicine and Rehabilitation

## 2017-08-04 VITALS — BP 134/87 | HR 81

## 2017-08-04 DIAGNOSIS — G8929 Other chronic pain: Secondary | ICD-10-CM

## 2017-08-04 DIAGNOSIS — M545 Low back pain, unspecified: Secondary | ICD-10-CM

## 2017-08-04 DIAGNOSIS — M47816 Spondylosis without myelopathy or radiculopathy, lumbar region: Secondary | ICD-10-CM

## 2017-08-04 DIAGNOSIS — M5136 Other intervertebral disc degeneration, lumbar region: Secondary | ICD-10-CM | POA: Diagnosis not present

## 2017-08-04 DIAGNOSIS — M961 Postlaminectomy syndrome, not elsewhere classified: Secondary | ICD-10-CM

## 2017-08-04 MED ORDER — MELOXICAM 15 MG PO TABS: 15.0000 mg | ORAL_TABLET | Freq: Every day | ORAL | 0 refills | Status: AC

## 2017-08-04 NOTE — Progress Notes (Signed)
Tanai Bouler - 42 y.o. female MRN [id number]  Date of birth: 10/27/1975  Office Visit Note: Visit Date: 08/04/2017 PCP: [facility] Referred by: [facility]  Subjective: Chief Complaint  Patient presents with  . Lower Back - Pain   HPI: Mrs. Caban is a pleasant active 42 year old female who comes in today at the request of Dr. August Saucer for evaluation management of her chronic axial low back pain.  She reports pretty severe back pain which can be fairly constant but worse with bending forward and particularly worse with laying flat on her back.  She can lie on her size with some relief.  She reports this is a constant sharp and aching pain.  She is really found nothing that has helped it.  She has not had recent physical therapy or chiropractic care.  She has a remote lumbar surgery which was a left-sided microdiscectomy by Dr. Venetia Maxon approximately 8 years ago.  She denies any radicular leg pain.  Dr. August Saucer has been following her for chronic worsening knee and shoulder pain.  He ultimately did get an MRI of her lumbar spine and this is reviewed below.  Patient reports worsening also with standing but not so much standing and walking.  Again she said no fevers chills or night sweats.  No unexplained weight loss.  She has had no spinal interventions or injections.  She has had    Review of Systems  Constitutional: Negative for chills, fever, malaise/fatigue and weight loss.  HENT: Negative for hearing loss and sinus pain.   Eyes: Negative for blurred vision, double vision and photophobia.  Respiratory: Negative for cough and shortness of breath.   Cardiovascular: Negative for chest pain, palpitations and leg swelling.  Gastrointestinal: Negative for abdominal pain, nausea and vomiting.  Genitourinary: Negative for flank pain.  Musculoskeletal: Positive for back pain. Negative for myalgias.  Skin: Negative for itching and rash.  Neurological: Negative for  tremors, focal weakness and weakness.  Endo/Heme/Allergies: Negative.   Psychiatric/Behavioral: Negative for depression.  All other systems reviewed and are negative.  Otherwise per HPI.  Assessment & Plan: Visit Diagnoses:  1. Spondylosis without myelopathy or radiculopathy, lumbar region   2. Chronic bilateral low back pain without sciatica   3. Degenerative disc disease, lumbar   4. Post laminectomy syndrome     Plan: Findings:  Chronic worsening severe axial low back pain has been going on for many months now not really responding to anti-inflammatories or muscle relaxers.  She has been taking Naprosyn but not really on a full dose.  MRI findings show degenerative disc height loss with endplate Modic type changes.  She also has facet arthropathy at L4-5 and L5-S1.  From L3-4 superiorly she has a fairly normal-appearing spine.  There is no focal stenosis or focal disc herniations or nerve compression.  She has mainly mechanical back pain that I feel like it is a combination probably of facet arthropathy as well as potentially changes from the degenerative nature of the lower disc.  She likely has some myofascial pain as well.  I think the first step is to regroup with a good physical therapist.  We will refer her to [facility] [place] for core strengthening and stabilization as well as manual myofascial release with dry needling potentially.  I also want to schedule her for diagnostic medial branch blocks of the lower facet joints just to see how much relief that she does have with this  diagnostic procedure determine if the facet joints are source of pain.  If she does not get much relief with medial branch blocks we may look at a one-time transforaminal epidural steroid injection at the lower level just to see how much is disc related.  She may ultimately be looking at prolonged intermittent low back pain because of the degenerative nature of the lower spine.  This will  likely fuse in the long run.  Looking further out depending on the relief could look at spinal cord stimulator versus lumbar fusion although I do not think she would do well with a lumbar fusion but it is obviously an option.  I am also going to prescribe meloxicam for her to take for the next few weeks as a good standard dosing to see if that gives her any added benefit.    Meds & Orders:  Meds ordered this encounter  Medications  . meloxicam (MOBIC) 15 MG tablet    Sig: Take 1 tablet (15 mg total) by mouth daily. Take with food    Dispense:  30 tablet    Refill:  0    Orders Placed This Encounter  Procedures  . Ambulatory referral to Physical Therapy    Follow-up: Return for Bilateral L4-5 and L5-S1 medial branch blocks.   Procedures: No procedures performed  No notes on file   Clinical History: MRI LUMBAR SPINE WITHOUT CONTRAST  TECHNIQUE: Multiplanar, multisequence MR imaging of the lumbar spine was performed. No intravenous contrast was administered.  COMPARISON:  10/29/2010  FINDINGS: Segmentation:  Standard.  Alignment:  Normal.  Vertebrae: No fracture or suspicious osseous lesion. Unchanged L1 vertebral body hemangioma. Progressive L5-S1 disc degeneration with new severe disc space height loss, moderate type 1 and type 2 Modic changes, and small L5 inferior endplate Schmorl's node.  Conus medullaris and cauda equina: Conus extends to the L1 level. There is a 4 mm T2 hypointense focus along the posterior thecal sac near the midline at the L4 level, less conspicuous on some sequences compared with the prior study though likely unchanged in size given its appearance on sagittal STIR image 8. This is less clearly extradural on today's examination.  Paraspinal and other soft tissues: Unremarkable.  Disc levels:  T12-L1 through L2-3: Negative.  L3-4: Mild facet hypertrophy.  Normal disc.  No stenosis.  L4-5: Disc desiccation. Mild disc bulging and  mild-to-moderate facet hypertrophy without stenosis, not significantly changed.  L5-S1: Interval left laminectomy and discectomy. Circumferential disc osteophyte complex asymmetric to the left and disc space height loss result in borderline to mild left neural foraminal stenosis without evidence of L5 nerve root compression. Patent spinal canal and neural foramina.  IMPRESSION: 1. Interval postoperative changes at L5-S1 with progressive, advanced disc degeneration. Mild left neural foraminal narrowing without spinal stenosis. 2. Mild-to-moderate facet arthrosis and mild disc bulging at L4-5 without stenosis. 3. Unchanged size of 4 mm nodule in the spinal canal at L4, less clearly extradural on today's study. A tiny meningioma or nerve sheath tumor are possibilities. Regardless, stability over nearly 7 years is consistent with a benign etiology.   Electronically Signed   By: Sebastian AcheAllen  Grady M.D.   On: 06/26/2017 16:07   She reports that  has never smoked. she has never used smokeless tobacco. No results for input(s): HGBA1C, LABURIC in the last 8760 hours.  Objective:  VS:  HT:    WT:   BMI:     BP:134/87  HR:81bpm  TEMP: ( )  RESP:  Physical Exam  Constitutional: She is oriented to person, place, and time. She appears well-developed and well-nourished.  Eyes: Conjunctivae and EOM are normal. Pupils are equal, round, and reactive to light.  Neck: Normal range of motion. Neck supple. No JVD present.  Cardiovascular: Normal rate and intact distal pulses.  Pulmonary/Chest: Effort normal.  Musculoskeletal:  Patient is somewhat slow to arise from a seated position.  She does have pain with extension of the lumbar spine.  She has paraspinal tenderness and taut bands and trigger points that reproduce some of her pain.  She has no pain with hip rotation bilaterally.  She has no pain over the greater trochanters and she has good distal strength without clonus.  She has a negative  slump test bilaterally.  Neurological: She is alert and oriented to person, place, and time. She exhibits normal muscle tone.  Skin: Skin is warm and dry. No rash noted. No erythema.  Psychiatric: She has a normal mood and affect. Her behavior is normal.  Nursing note and vitals reviewed.   Ortho Exam Imaging: No results found.  Past Medical/Family/Surgical/Social History: Medications & Allergies reviewed per EMR, new medications updated. There are no active problems to display for this patient.  History reviewed. No pertinent past medical history. Family History  Problem Relation Age of Onset  . Arthritis Mother   . Hypertension Mother   . Stroke Mother        two  . Hypertension Father    Past Surgical History:  Procedure Laterality Date  . SPINE SURGERY  11-16-10   L5-S1 discetomy   Social History   Occupational History  . Not on file  Tobacco Use  . Smoking status: Never Smoker  . Smokeless tobacco: Never Used  Substance and Sexual Activity  . Alcohol use: Not on file  . Drug use: Not on file  . Sexual activity: Not on file

## 2017-08-04 NOTE — Progress Notes (Signed)
Numeric Pain Rating Scale and Functional Assessment Average Pain 7 Pain Right Now 7 My pain is constant, sharp and aching Pain is worse with: lying down Pain improves with: nothing   In the last MONTH (on 0-10 scale) has pain interfered with the following?  1. General activity like being  able to carry out your everyday physical activities such as walking, climbing stairs, carrying groceries, or moving a chair?  Rating(10)  2. Relation with others like being able to carry out your usual social activities and roles such as  activities at home, at work and in your community. Rating(10)  3. Enjoyment of life such that you have  been bothered by emotional problems such as feeling anxious, depressed or irritable?  Rating(10)

## 2017-08-09 ENCOUNTER — Other Ambulatory Visit: Payer: Self-pay

## 2017-08-09 ENCOUNTER — Ambulatory Visit: Payer: 59 | Attending: Physical Medicine and Rehabilitation | Admitting: Physical Therapy

## 2017-08-09 ENCOUNTER — Encounter: Payer: Self-pay | Admitting: Physical Therapy

## 2017-08-09 DIAGNOSIS — G8929 Other chronic pain: Secondary | ICD-10-CM | POA: Insufficient documentation

## 2017-08-09 DIAGNOSIS — M6281 Muscle weakness (generalized): Secondary | ICD-10-CM | POA: Diagnosis present

## 2017-08-09 DIAGNOSIS — M545 Low back pain: Secondary | ICD-10-CM | POA: Insufficient documentation

## 2017-08-09 DIAGNOSIS — R293 Abnormal posture: Secondary | ICD-10-CM | POA: Insufficient documentation

## 2017-08-09 NOTE — Patient Instructions (Addendum)

## 2017-08-09 NOTE — Therapy (Signed)
[facility] [address] [place], [place], [address] Phone: [phone]   Fax:  [phone]  Physical Therapy Evaluation  Patient Details  Name: Marie Barr MRN: [id number] Date of Birth: [birth year] Referring Provider: Tyrell Antonio, MD    Encounter Date: 08/09/2017  PT End of Session - 08/09/17 1347    Visit Number  1    Date for PT Re-Evaluation  09/20/17    Authorization Type  [employer]    Authorization Time Period  08/09/17 to 09/20/17    PT Start Time  1230    PT Stop Time  1312    PT Time Calculation (min)  42 min    Activity Tolerance  No increased pain;Patient tolerated treatment well    Behavior During Therapy  [facility] for tasks assessed/performed       History reviewed. No pertinent past medical history.  Past Surgical History:  Procedure Laterality Date  . SPINE SURGERY  11-16-10   L5-S1 discetomy    There were no vitals filed for this visit.   Subjective Assessment - 08/09/17 1240    Subjective  Pt reports that she has had on and off back pain for many years. She noticed in October that her pain wouldn't go away at the end of the day. Around Christmas, she bent over to pick up a piece of cardboard from the floor and her back went into spasm. Since then, her back will go into spasm maybe once or twice a week. It is usually deep bending which she has since tried to avoid. She got imaging done at the doctor and they found arthritis which she was very surprised by.     Pertinent History  L5/S1 discectomy 2012    Limitations  Other (comment) bending or laying flat on her back     How long can you sit comfortably?  unlimited     Patient Stated Goals  be able to move and complete daily activity without as much pain    Currently in Pain?  Yes    Pain Score  4     Pain Location  Sacrum    Pain Orientation  Right;Left;Lower    Pain Descriptors / Indicators  Aching;Dull    Pain Type  Chronic pain    Pain  Radiating Towards  none     Pain Onset  More than a month ago    Pain Frequency  Constant    Aggravating Factors   bending, sleeping flat on back     Pain Relieving Factors  sidelying is most relieving; sitting     Effect of Pain on Daily Activities  difficulty with household activity and recreational activity; sleeping through the night without pain          [facility] PT Assessment - 08/09/17 0001      Assessment   Medical Diagnosis  chronic low back pain     Referring Provider  Tyrell Antonio, MD     Onset Date/Surgical Date  -- october 2018    Next MD Visit  08/24/17    Prior Therapy  none       Precautions   Precautions  None      Balance Screen   Has the patient fallen in the past 6 months  No    Has the patient had a decrease in activity level because of a fear of falling?   No    Is the patient reluctant to leave their home because of  a fear of falling?   No      Home Public house managernvironment   Living Environment  Private residence      Prior Function   Level of Independence  Independent    Vocation Requirements  works at [employer], lifting and alot of standing       Cognition   Overall Cognitive Status  Within Functional Limits for tasks assessed      Observation/Other Assessments   Focus on Therapeutic Outcomes (FOTO)   53% limited       Sensation   Additional Comments  pt denies numbness/tingling       Posture/Postural Control   Posture Comments  slouched shoulders, forward head and decrease lumbar lordosis       ROM / Strength   AROM / PROM / Strength  AROM;Strength      AROM   AROM Assessment Site  Thoracic;Lumbar    Lumbar Flexion  flat back, pull/pain across low back    Lumbar Extension  end range discomfort noted    Lumbar - Right Rotation  WNL, pain free    Lumbar - Left Rotation  WNL, pain free      Strength   Strength Assessment Site  Lumbar;Hip;Knee;Ankle    Right/Left Hip  Right;Left    Right Hip Flexion  4/5    Right Hip Extension  4/5 pain Lt side low back     Right Hip ABduction  5/5    Left Hip Flexion  4/5    Left Hip Extension  4/5 pain Rt side low back     Left Hip ABduction  5/5    Right/Left Knee  Right;Left    Right Knee Flexion  4/5    Right Knee Extension  5/5    Left Knee Flexion  4/5    Left Knee Extension  5/5    Right/Left Ankle  Right;Left    Right Ankle Dorsiflexion  5/5    Left Ankle Dorsiflexion  5/5    Lumbar Flexion  3/5    Lumbar Extension  3/5      Flexibility   Soft Tissue Assessment /Muscle Length  yes    Hamstrings  WNL    Quadriceps  WNL      Palpation   Palpation comment  muscle spasm and tenderness along the proximal glutes, lumbar paraspinals (Lt>Rt)      Special Tests   Other special tests  positive SLR on Lt, negative on Rt              Objective measurements completed on examination: See above findings.      [facility] Adult PT Treatment/Exercise - 08/09/17 0001      Exercises   Exercises  Lumbar      Lumbar Exercises: Seated   Other Seated Lumbar Exercises  Abdominal brace with BUE press and exhale into chair x5 reps             PT Education - 08/09/17 1342    Education provided  Yes    Education Details  eval findings/POC; HEP implemented; benefits of improving trunk/LE strength; discussed dry needling and benefits     Person(s) Educated  Patient    Methods  Explanation;Handout    Comprehension  Verbalized understanding;Returned demonstration       PT Short Term Goals - 08/09/17 1359      PT SHORT TERM GOAL #1   Title  Pt will demo consistency and independence with her HEP to improve mechanics and decrease pain.  Time  3    Period  Weeks    Status  New    [employer] Date  08/30/17      PT SHORT TERM GOAL #2   Title  Pt will report atleast 30% improvement in her pain from the start of PT to increase her tolerance of activity at work.     Time  3    Period  Weeks    Status  New      PT SHORT TERM GOAL #3   Title  Pt will demo good understanding of the importance of  regular fitness routines, evident by her report of walking atleast 30 min, 3x/ week.     Time  3    Period  Weeks    Status  New      PT SHORT TERM GOAL #4   Title  Pt will demo improved pain and spasm evident by her ability to lay supine during her session with no more than 3/10 pain report.     Time  3    Period  Weeks    Status  New        PT Long Term Goals - 08/09/17 1403      PT LONG TERM GOAL #1   Title  Pt will demo improved BLE strength to 5/5 MMT which will improve her efficiency with daily activity and assist with proper lifting mechanics.     Time  6    Period  Weeks    Status  New    [employer] Date  09/20/17      PT LONG TERM GOAL #2   Title  Pt will be able to lift 10# box from the floor with proper mechanics, 2/3 trials, without therapist cuing which will allow her to lift small objects from the floor throughout the day at home and work.     Time  6    Period  Weeks    Status  New      PT LONG TERM GOAL #3   Title  Pt will report atleast 60% decrease in her pain and muscle spasm from the start of PT to allow her to complete daily work activities without assistance or rest breaks.    Time  6    Period  Weeks    Status  New             Plan - 08/09/17 1348    Clinical Impression Statement  Pt was referred to OPPT with complaints of recent exacerbation of chronic low back/sacral pain. She demonstrates overall good BLE strength, except for the posterior chain which is 4/5 MMT. Her lumbar ROM is limited into flexion and painful most directions. She demonstrates poor trunk strength and endurance, evident by her increased difficulty completing transitions on the mat table during today's evaluation. Pt has increased pain with bending forward, lifting and laying on her back and has palpable muscle spasm along the gluteals and lumbar paraspinals. She would benefit from skilled PT to address her current limitations listed above and improve her strength, mechanics with  daily activity, and improve her ability to manage her pain throughout the day without the need for assistance from others.     History and Personal Factors relevant to plan of care:  L5/S1 discectomy in 2012    Clinical Presentation  Stable    Clinical Decision Making  Low    Rehab Potential  Good    PT Frequency  2x / week  PT Duration  6 weeks    PT Treatment/Interventions  ADLs/Self Care Home Management;Cryotherapy;Electrical Stimulation;Moist Heat;Functional mobility training;Therapeutic activities;Therapeutic exercise;Patient/family education;Neuromuscular re-education;Manual techniques;Passive range of motion;Dry needling;Taping    PT Next Visit Plan  Dry needling Lumbar/sacral multifidi/glute max; hip extensor strengthening; trunk strengthening/stability progressions    PT Home Exercise Plan  seated BUE press with abdominal activation    Consulted and Agree with Plan of Care  Patient       Patient will benefit from skilled therapeutic intervention in order to improve the following deficits and impairments:  Decreased activity tolerance, Improper body mechanics, Pain, Postural dysfunction, Increased muscle spasms, Decreased mobility, Hypomobility, Decreased strength, Decreased range of motion, Impaired flexibility  Visit Diagnosis: Chronic bilateral low back pain, with sciatica presence unspecified  Muscle weakness (generalized)  Abnormal posture     Problem List There are no active problems to display for this patient.   2:46 PM,08/09/17 Donita Brooks PT, DPT [facility]  [phone]  [facility] [address], [address] [place], [place], [address] Phone: [phone]   Fax:  [phone]  Name: Marie Barr MRN: [id number] Date of Birth: 1976-03-20

## 2017-08-10 ENCOUNTER — Ambulatory Visit: Payer: 59 | Admitting: Physical Therapy

## 2017-08-10 DIAGNOSIS — M545 Low back pain: Secondary | ICD-10-CM | POA: Diagnosis not present

## 2017-08-10 DIAGNOSIS — M6281 Muscle weakness (generalized): Secondary | ICD-10-CM

## 2017-08-10 DIAGNOSIS — R293 Abnormal posture: Secondary | ICD-10-CM

## 2017-08-10 DIAGNOSIS — G8929 Other chronic pain: Secondary | ICD-10-CM

## 2017-08-10 NOTE — Patient Instructions (Signed)
Balloon Breath    Place hands LIGHTLY on belly below navel. Imagine a balloon inside belly. Blow up balloon on breath IN, deflate balloon on breath OUT. Contract abdominals slightly to assist breath OUT. 5 breaths 3x/day.  Copyright  VHI. All rights reserved.    [facility] [address], [address] [place], [place] [address] Phone # [phone] Fax [phone]

## 2017-08-10 NOTE — Therapy (Signed)
[facility] [address], [address] [place], [place], [address] Phone: [phone]   Fax:  [phone]  Physical Therapy Treatment  Patient Details  Name: Marie Barr MRN: [id number] Date of Birth: Jul 08, 1975 Referring Provider: Tyrell Antonio, MD    Encounter Date: 08/10/2017  PT End of Session - 08/10/17 1612    Visit Number  2    Date for PT Re-Evaluation  09/20/17    Authorization Type  U[employer]    Authorization Time Period  08/09/17 to 09/20/17    PT Start Time  1449    PT Stop Time  1540    PT Time Calculation (min)  51 min    Activity Tolerance  No increased pain;Patient tolerated treatment well    Behavior During Therapy  [facility] for tasks assessed/performed       No past medical history on file.  Past Surgical History:  Procedure Laterality Date  . SPINE SURGERY  11-16-10   L5-S1 discetomy    There were no vitals filed for this visit.  Subjective Assessment - 08/10/17 1609    Subjective  Pt was here yesterday and states she did not have time to do the exercises yet. I feel like it is hard to say how the pain feels because I can ignore it.    Patient Stated Goals  be able to move and complete daily activity without as much pain    Currently in Pain?  Yes    Pain Score  4     Pain Location  Sacrum    Pain Orientation  Left;Lower    Pain Descriptors / Indicators  Aching    Pain Type  Chronic pain    Pain Onset  More than a month ago    Pain Frequency  Constant    Multiple Pain Sites  No                      [facility] Adult PT Treatment/Exercise - 08/10/17 0001      Neuro Re-ed    Neuro Re-ed Details   TrA bracing and diaphragmatic breathing in sitting, UE flexion with 1 lb weight with TrA brace 10 reps      Modalities   Modalities  Electrical Stimulation      Moist Heat Therapy   Number Minutes Moist Heat  15 Minutes    Moist Heat Location  Lumbar Spine      Electrical Stimulation   Electrical  Stimulation Location  lumbar in sitting    Electrical Stimulation Action  IFC    Electrical Stimulation Parameters  to tolerance    Electrical Stimulation Goals  Pain      Manual Therapy   Manual Therapy  Soft tissue mobilization    Soft tissue mobilization  lumbar paraspinals       Trigger Point Dry Needling - 08/10/17 1623    Consent Given?  Yes    Education Handout Provided  Yes    Muscles Treated Lower Body  -- lumbar and sacral multifidi           PT Education - 08/10/17 1613    Education provided  Yes    Education Details  balloon breathing    Person(s) Educated  Patient    Methods  Explanation;Demonstration;Handout    Comprehension  Verbalized understanding;Returned demonstration       PT Short Term Goals - 08/09/17 1359      PT SHORT TERM GOAL #1   Title  Pt will demo consistency and independence with her HEP to improve mechanics and decrease pain.     Time  3    Period  Weeks    Status  New    Target Date  08/30/17      PT SHORT TERM GOAL #2   Title  Pt will report atleast 30% improvement in her pain from the start of PT to increase her tolerance of activity at work.     Time  3    Period  Weeks    Status  New      PT SHORT TERM GOAL #3   Title  Pt will demo good understanding of the importance of regular fitness routines, evident by her report of walking atleast 30 min, 3x/ week.     Time  3    Period  Weeks    Status  New      PT SHORT TERM GOAL #4   Title  Pt will demo improved pain and spasm evident by her ability to lay supine during her session with no more than 3/10 pain report.     Time  3    Period  Weeks    Status  New        PT Long Term Goals - 08/09/17 1403      PT LONG TERM GOAL #1   Title  Pt will demo improved BLE strength to 5/5 MMT which will improve her efficiency with daily activity and assist with proper lifting mechanics.     Time  6    Period  Weeks    Status  New    Target Date  09/20/17      PT LONG TERM GOAL #2    Title  Pt will be able to lift 10# box from the floor with proper mechanics, 2/3 trials, without therapist cuing which will allow her to lift small objects from the floor throughout the day at home and work.     Time  6    Period  Weeks    Status  New      PT LONG TERM GOAL #3   Title  Pt will report atleast 60% decrease in her pain and muscle spasm from the start of PT to allow her to complete daily work activities without assistance or rest breaks.    Time  6    Period  Weeks    Status  New            Plan - 08/10/17 1614    Clinical Impression Statement  Pt had twitch response and muscle elongation during manual and dry needling treatment today.  She was able to perform diaphargmatic breathing but needed cues and has to perform slowly due to increased processing time.  Pt will benefit from skilled PT to work on coordination for improved core stability and continue to work on reduced muscle spasm for pain management.      Rehab Potential  Good    PT Treatment/Interventions  ADLs/Self Care Home Management;Cryotherapy;Electrical Stimulation;Moist Heat;Functional mobility training;Therapeutic activities;Therapeutic exercise;Patient/family education;Neuromuscular re-education;Manual techniques;Passive range of motion;Dry needling;Taping    PT Next Visit Plan  f/u on Dry needling Lumbar/sacral multifidi/glute max; hip extensor strengthening; trunk strengthening/stability progressions, wall push, finding comfortable supine position    Consulted and Agree with Plan of Care  Patient       Patient will benefit from skilled therapeutic intervention in order to improve the following deficits and impairments:  Decreased activity tolerance, Improper  body mechanics, Pain, Postural dysfunction, Increased muscle spasms, Decreased mobility, Hypomobility, Decreased strength, Decreased range of motion, Impaired flexibility  Visit Diagnosis: Chronic bilateral low back pain, with sciatica presence  unspecified  Muscle weakness (generalized)  Abnormal posture     Problem List There are no active problems to display for this patient.   Vincente Poli, PT 08/10/2017, 5:46 PM  [facility] [address], [address] [place], [place], [address] Phone: [phone]   Fax:  [phone]  Name: Marie Barr MRN: [id number] Date of Birth: [birth year]

## 2017-08-15 ENCOUNTER — Ambulatory Visit: Payer: 59 | Admitting: Physical Therapy

## 2017-08-15 ENCOUNTER — Encounter: Payer: Self-pay | Admitting: Physical Therapy

## 2017-08-15 DIAGNOSIS — G8929 Other chronic pain: Secondary | ICD-10-CM

## 2017-08-15 DIAGNOSIS — M545 Low back pain: Secondary | ICD-10-CM | POA: Diagnosis not present

## 2017-08-15 DIAGNOSIS — M6281 Muscle weakness (generalized): Secondary | ICD-10-CM

## 2017-08-15 DIAGNOSIS — R293 Abnormal posture: Secondary | ICD-10-CM

## 2017-08-15 NOTE — Patient Instructions (Signed)
PELVIC STABILIZATION: Pelvic Tilt (Lying)    Exhaling, pull belly toward spine, tilting pelvis forward. Hold for 5 seconds. Inhaling, release. Repeat _10__ times. Do _2__ times per day.  Copyright  VHI. All rights reserved.   Knee Fold   Lie on back, legs bent, arms by sides. Exhale, lifting knee to chest. Inhale, returning. Keep abdominals flat, navel to spine. Repeat __10__ times, alternating legs. Do __2__ sessions per day.  Copyright  VHI. All rights reserved.    Knee Drop   Keep pelvis stable. Without rotating hips, slowly drop knee to side, pause, return to center, bring knee across midline toward opposite hip. Feel obliques engaging. Repeat for ___10_ times each leg.   Heel Slide to Straight   Slide one leg down to straight. Return. Be sure pelvis does not rock forward, tilt, rotate, or tip to side. Do _10__ times. Restabilize pelvis. Repeat with other leg. Do __1-2_ sets, __2_ times per day.   Bridging     Slowly raise buttocks from floor, keeping stomach tight.  Hold for 5 seconds. Repeat __10__ times per set. Do __1__ sets per session. Do __2__ sessions per day.  Strengthening: Hip Abductor - Resisted    With band looped around both legs above knees, push thighs apart one at a time. Repeat __10__ times on each side per set. Do _1___ sets per session. Do _2___ sessions per day.

## 2017-08-15 NOTE — Therapy (Signed)
[facility] [address], [address] [place], [place], [address] Phone: [phone]   Fax:  [phone]  Physical Therapy Treatment  Patient Details  Name: Marie Barr MRN: [id number] Date of Birth: September 14, 1975 Referring Provider: Tyrell Antonio, MD    Encounter Date: 08/15/2017  PT End of Session - 08/15/17 0927    Visit Number  3    Date for PT Re-Evaluation  09/20/17    Authorization Type  UHC    Authorization Time Period  08/09/17 to 09/20/17    PT Start Time  0852    PT Stop Time  0927    PT Time Calculation (min)  35 min    Activity Tolerance  No increased pain;Patient tolerated treatment well    Behavior During Therapy  [facility] for tasks assessed/performed       History reviewed. No pertinent past medical history.  Past Surgical History:  Procedure Laterality Date  . SPINE SURGERY  11-16-10   L5-S1 discetomy    There were no vitals filed for this visit.  Subjective Assessment - 08/15/17 0852    Subjective  DN significantly helped; back feels much better.      Patient Stated Goals  be able to move and complete daily activity without as much pain    Currently in Pain?  Yes    Pain Score  2     Pain Location  Sacrum    Pain Orientation  Left;Lower    Pain Descriptors / Indicators  Aching    Pain Type  Chronic pain    Pain Onset  More than a month ago    Pain Frequency  Constant    Aggravating Factors   bending, sleeping flat on back    Pain Relieving Factors  sidelying is most relieving, sitting                No data recorded       [facility] Adult PT Treatment/Exercise - 08/15/17 0854      Lumbar Exercises: Aerobic   Nustep  L5 x 6 min      Lumbar Exercises: Standing   Heel Raises  20 reps toe raises x 20    Other Standing Lumbar Exercises  hip abdct/ext x10 each; bil      Lumbar Exercises: Seated   Long Arc Quad on [place]  Both;10 reps    Hip Flexion on [place]  Both;20 reps      Lumbar  Exercises: Supine   Pelvic Tilt  10 reps;5 seconds    Clam  10 reps alternating with PPT    Heel Slides  10 reps alternating with PPT    Bent Knee Raise  10 reps alternating with PPT    Bridge  10 reps;5 seconds    Other Supine Lumbar Exercises  hooklying single limb clamshell x 10 reps; green theraband             PT Education - 08/15/17 0926    Education provided  Yes    Education Details  HEP    Person(s) Educated  Patient    Methods  Explanation;Demonstration;Handout    Comprehension  Verbalized understanding;Returned demonstration       PT Short Term Goals - 08/09/17 1359      PT SHORT TERM GOAL #1   Title  Pt will demo consistency and independence with her HEP to improve mechanics and decrease pain.     Time  3    Period  Weeks  Status  New    Target Date  08/30/17      PT SHORT TERM GOAL #2   Title  Pt will report atleast 30% improvement in her pain from the start of PT to increase her tolerance of activity at work.     Time  3    Period  Weeks    Status  New      PT SHORT TERM GOAL #3   Title  Pt will demo good understanding of the importance of regular fitness routines, evident by her report of walking atleast 30 min, 3x/ week.     Time  3    Period  Weeks    Status  New      PT SHORT TERM GOAL #4   Title  Pt will demo improved pain and spasm evident by her ability to lay supine during her session with no more than 3/10 pain report.     Time  3    Period  Weeks    Status  New        PT Long Term Goals - 08/09/17 1403      PT LONG TERM GOAL #1   Title  Pt will demo improved BLE strength to 5/5 MMT which will improve her efficiency with daily activity and assist with proper lifting mechanics.     Time  6    Period  Weeks    Status  New    Target Date  09/20/17      PT LONG TERM GOAL #2   Title  Pt will be able to lift 10# box from the floor with proper mechanics, 2/3 trials, without therapist cuing which will allow her to lift small objects  from the floor throughout the day at home and work.     Time  6    Period  Weeks    Status  New      PT LONG TERM GOAL #3   Title  Pt will report atleast 60% decrease in her pain and muscle spasm from the start of PT to allow her to complete daily work activities without assistance or rest breaks.    Time  6    Period  Weeks    Status  New            Plan - 08/15/17 6644    Clinical Impression Statement  Pt with great response to DN and tolerated core strengthening exercises well today.  Progressing well with PT.    PT Treatment/Interventions  ADLs/Self Care Home Management;Cryotherapy;Electrical Stimulation;Moist Heat;Functional mobility training;Therapeutic activities;Therapeutic exercise;Patient/family education;Neuromuscular re-education;Manual techniques;Passive range of motion;Dry needling;Taping    PT Next Visit Plan  review HEP, DN/manual/modalities PRN, continue core/hip strengthening    Consulted and Agree with Plan of Care  Patient       Patient will benefit from skilled therapeutic intervention in order to improve the following deficits and impairments:  Decreased activity tolerance, Improper body mechanics, Pain, Postural dysfunction, Increased muscle spasms, Decreased mobility, Hypomobility, Decreased strength, Decreased range of motion, Impaired flexibility  Visit Diagnosis: Chronic bilateral low back pain, with sciatica presence unspecified  Muscle weakness (generalized)  Abnormal posture     Problem List There are no active problems to display for this patient.     Clarita Crane, PT, DPT 08/15/17 9:28 AM    [facility] [address] [place], [place], [address] Phone: [phone]   Fax:  [phone]  Name: Marie Barr  MRN: [id number] Date of Birth: 11/21/1975

## 2017-08-17 ENCOUNTER — Encounter: Payer: Self-pay | Admitting: Physical Therapy

## 2017-08-17 ENCOUNTER — Ambulatory Visit: Payer: 59 | Admitting: Physical Therapy

## 2017-08-17 DIAGNOSIS — M545 Low back pain: Principal | ICD-10-CM

## 2017-08-17 DIAGNOSIS — R293 Abnormal posture: Secondary | ICD-10-CM

## 2017-08-17 DIAGNOSIS — M6281 Muscle weakness (generalized): Secondary | ICD-10-CM

## 2017-08-17 DIAGNOSIS — G8929 Other chronic pain: Secondary | ICD-10-CM

## 2017-08-17 NOTE — Therapy (Addendum)
[facility] [address] [place], [place], [address] Phone: [phone]   Fax:  [phone]  Physical Therapy Treatment/Discharge  Patient Details  Name: Marie Barr MRN: [id number] Date of Birth: 1976/05/07 Referring Provider: Magnus Sinning, MD    Encounter Date: 08/17/2017  PT End of Session - 08/17/17 1337    Visit Number  4    Date for PT Re-Evaluation  09/20/17    Authorization Type  UHC    Authorization Time Period  08/09/17 to 09/20/17    PT Start Time  7591 pt arrived late    PT Stop Time  0922    PT Time Calculation (min)  30 min    Activity Tolerance  No increased pain;Patient tolerated treatment well    Behavior During Therapy  [facility] for tasks assessed/performed       History reviewed. No pertinent past medical history.  Past Surgical History:  Procedure Laterality Date  . SPINE SURGERY  11-16-10   L5-S1 discetomy    There were no vitals filed for this visit.  Subjective Assessment - 08/17/17 0853    Subjective  feels like she's almost back to square one, pain back up to 8/10.  felt good when leaving PT on Monday, then by Tuesday afternoon had a hard time walking.  Reports increased activity over 2 days and thinks she may have over done it.    Patient Stated Goals  be able to move and complete daily activity without as much pain    Currently in Pain?  Yes    Pain Score  8     Pain Location  Sacrum    Pain Orientation  Left;Lower;Right    Pain Descriptors / Indicators  Aching    Pain Type  Chronic pain    Pain Onset  More than a month ago    Pain Frequency  Constant    Aggravating Factors   bending, sleeping flat on back    Pain Relieving Factors  sidelying is most relieving, sitting         [facility] PT Assessment - 08/17/17 0935      Palpation   SI assessment   increased muscle tightness Rt paraspinals, and Lt glutes; increased movement Lt SIJ            No data  recorded       [facility] Adult PT Treatment/Exercise - 08/17/17 0856      Lumbar Exercises: Aerobic   Nustep  L3 x 5 min      Manual Therapy   Manual Therapy  Soft tissue mobilization    Manual therapy comments  skilled palpation and monitoring of soft tissue during DN    Soft tissue mobilization  lumbar paraspinals; Lt glutes       Trigger Point Dry Needling - 08/17/17 6384    Consent Given?  Yes    Muscles Treated Upper Body  Longissimus lumbar paraspinals and multifidi    Muscles Treated Lower Body  Gluteus maximus and glute med    Longissimus Response  Twitch response elicited;Palpable increased muscle length    Gluteus Maximus Response  Twitch response elicited;Palpable increased muscle length             PT Short Term Goals - 08/09/17 1359      PT SHORT TERM GOAL #1   Title  Pt will demo consistency and independence with her HEP to improve mechanics and decrease pain.     Time  3  Period  Weeks    Status  New    Target Date  08/30/17      PT SHORT TERM GOAL #2   Title  Pt will report atleast 30% improvement in her pain from the start of PT to increase her tolerance of activity at work.     Time  3    Period  Weeks    Status  New      PT SHORT TERM GOAL #3   Title  Pt will demo good understanding of the importance of regular fitness routines, evident by her report of walking atleast 30 min, 3x/ week.     Time  3    Period  Weeks    Status  New      PT SHORT TERM GOAL #4   Title  Pt will demo improved pain and spasm evident by her ability to lay supine during her session with no more than 3/10 pain report.     Time  3    Period  Weeks    Status  New        PT Long Term Goals - 08/09/17 1403      PT LONG TERM GOAL #1   Title  Pt will demo improved BLE strength to 5/5 MMT which will improve her efficiency with daily activity and assist with proper lifting mechanics.     Time  6    Period  Weeks    Status  New    Target Date  09/20/17      PT  LONG TERM GOAL #2   Title  Pt will be able to lift 10# box from the floor with proper mechanics, 2/3 trials, without therapist cuing which will allow her to lift small objects from the floor throughout the day at home and work.     Time  6    Period  Weeks    Status  New      PT LONG TERM GOAL #3   Title  Pt will report atleast 60% decrease in her pain and muscle spasm from the start of PT to allow her to complete daily work activities without assistance or rest breaks.    Time  6    Period  Weeks    Status  New            Plan - 08/17/17 1337    Clinical Impression Statement  Pt returned today with elevated pain likely due to overuse x 2 days.  DN performed again to Lt glutes and bil lumbar multifidi and Rt paraspinals, which pt tolerated well.  Will continue to benefit from PT to maximize function.    PT Treatment/Interventions  ADLs/Self Care Home Management;Cryotherapy;Electrical Stimulation;Moist Heat;Functional mobility training;Therapeutic activities;Therapeutic exercise;Patient/family education;Neuromuscular re-education;Manual techniques;Passive range of motion;Dry needling;Taping    PT Next Visit Plan  review HEP, DN/manual/modalities PRN, continue core/hip strengthening    Consulted and Agree with Plan of Care  Patient       Patient will benefit from skilled therapeutic intervention in order to improve the following deficits and impairments:  Decreased activity tolerance, Improper body mechanics, Pain, Postural dysfunction, Increased muscle spasms, Decreased mobility, Hypomobility, Decreased strength, Decreased range of motion, Impaired flexibility  Visit Diagnosis: Chronic bilateral low back pain, with sciatica presence unspecified  Muscle weakness (generalized)  Abnormal posture     Problem List There are no active problems to display for this patient.     Laureen Abrahams, PT, DPT 08/17/17 1:40 PM  [facility] [address], [place], [place], [address] Phone: [phone]   Fax:  [phone]  Name: Marie Barr MRN: [id number] Date of Birth: [birth year]     PHYSICAL THERAPY DISCHARGE SUMMARY  Visits from Start of Care: 4  Current functional level related to goals / functional outcomes: See above   Remaining deficits: Unknown, pt did not return   Education / Equipment: HEP  Plan: Patient agrees to discharge.  Patient goals were not met. Patient is being discharged due to not returning since the last visit.  ?????    Laureen Abrahams, PT, DPT 01/19/18 10:34 AM   [facility] [place], [place] [address]  [phone] (office) [phone] (fax)

## 2017-08-24 ENCOUNTER — Ambulatory Visit (INDEPENDENT_AMBULATORY_CARE_PROVIDER_SITE_OTHER): Payer: 59

## 2017-08-24 ENCOUNTER — Ambulatory Visit (INDEPENDENT_AMBULATORY_CARE_PROVIDER_SITE_OTHER): Payer: 59 | Admitting: Physical Medicine and Rehabilitation

## 2017-08-24 ENCOUNTER — Encounter (INDEPENDENT_AMBULATORY_CARE_PROVIDER_SITE_OTHER): Payer: Self-pay | Admitting: Physical Medicine and Rehabilitation

## 2017-08-24 ENCOUNTER — Ambulatory Visit (INDEPENDENT_AMBULATORY_CARE_PROVIDER_SITE_OTHER): Payer: Self-pay | Admitting: Physical Medicine and Rehabilitation

## 2017-08-24 VITALS — BP 127/80 | HR 79 | Temp 98.1°F

## 2017-08-24 DIAGNOSIS — M47816 Spondylosis without myelopathy or radiculopathy, lumbar region: Secondary | ICD-10-CM

## 2017-08-24 MED ORDER — DEXAMETHASONE SODIUM PHOSPHATE 10 MG/ML IJ SOLN
15.0000 mg | Freq: Once | INTRAMUSCULAR | Status: AC
  Administered 2017-08-24: 15 mg

## 2017-08-24 MED ORDER — BUPIVACAINE HCL 0.5 % IJ SOLN
3.0000 mL | Freq: Once | INTRAMUSCULAR | Status: AC
  Administered 2017-08-24: 3 mL

## 2017-08-24 NOTE — Progress Notes (Signed)
  Numeric Pain Rating Scale and Functional Assessment Average Pain 4   In the last MONTH (on 0-10 scale) has pain interfered with the following?  1. General activity like being  able to carry out your everyday physical activities such as walking, climbing stairs, carrying groceries, or moving a chair?  Rating(2)   +Driver, -BT, -Dye Allergies. 

## 2017-08-24 NOTE — Patient Instructions (Signed)

## 2017-08-31 NOTE — Progress Notes (Signed)
Marie FallingBrenda Barr - 42 y.o. female MRN [id number]  Date of birth: 01/10/1976  Office Visit Note: Visit Date: 08/24/2017 PCP: Trey SailorsPa, [facility] Referred by: Trey SailorsPa, [facility]  Subjective: Chief Complaint  Patient presents with  . Lower Back - Pain   HPI: Marie Barr is a 42 year old female that comes in today for planned bilateral L4-5 and L5-S1 medial branch blocks diagnostically and somewhat therapeutically using a small amount of steroid.  She has been going to physical therapy she reports dry needling has helped significantly but she still has continued axial low back pain worse with standing and extension.  She has exam consistent with facet pain with loading.   ROS Otherwise per HPI.  Assessment & Plan: Visit Diagnoses:  1. Spondylosis without myelopathy or radiculopathy, lumbar region     Plan: No additional findings.   Meds & Orders:  Meds ordered this encounter  Medications  . bupivacaine (MARCAINE) 0.5 % (with pres) injection 3 mL  . dexamethasone (DECADRON) injection 15 mg    Orders Placed This Encounter  Procedures  . Facet Injection  . XR C-ARM NO REPORT    Follow-up: Return if symptoms worsen or fail to improve.   Procedures: No procedures performed  Lumbar Diagnostic Facet Joint Nerve Block with Fluoroscopic Guidance   Patient: Marie Barr      Date of Birth: 03/29/1976 MRN: [id number] PCP: Trey SailorsPa, [facility]      Visit Date: 08/24/2017   Universal Protocol:    Date/Time: 04/10/195:32 AM  Consent Given By: the patient  Position: PRONE  Additional Comments: Vital signs were monitored before and after the procedure. Patient was prepped and draped in the usual sterile fashion. The correct patient, procedure, and site was verified.   Injection Procedure Details:  Procedure Site One Meds Administered:  Meds ordered this encounter  Medications  . bupivacaine (MARCAINE) 0.5 % (with pres) injection 3 mL  .  dexamethasone (DECADRON) injection 15 mg     Laterality: Bilateral  Location/Site:  L4-L5 L5-S1  Needle size: 22 ga.  Needle type:spinal  Needle Placement: Oblique pedical  Findings:   -Comments: There was excellent flow of contrast along the articular pillars without intravascular flow.  Procedure Details: The fluoroscope beam is vertically oriented in AP and then obliqued 15 to 20 degrees to the ipsilateral side of the desired nerve to achieve the "Scotty dog" appearance.  The skin over the target area of the junction of the superior articulating process and the transverse process (sacral ala if blocking the L5 dorsal rami) was locally anesthetized with a 1 ml volume of 1% Lidocaine without Epinephrine.  The spinal needle was inserted and advanced in a trajectory view down to the target.   After contact with periosteum and negative aspirate for blood and CSF, correct placement without intravascular or epidural spread was confirmed by injecting 0.5 ml. of Isovue-250.  A spot radiograph was obtained of this image.    Next, a 0.5 ml. volume of the injectate described above was injected. The needle was then redirected to the other facet joint nerves mentioned above if needed.  Prior to the procedure, the patient was given a Pain Diary which was completed for baseline measurements.  After the procedure, the patient rated their pain every 30 minutes and will continue rating at this frequency for a total of 5 hours.  The patient has been asked to complete the Diary and return to us by mail, fax or hand delivered  as soon as possible.   Additional Comments:  The patient tolerated the procedure well Dressing: Band-Aid    Post-procedure details: Patient was observed during the procedure. Post-procedure instructions were reviewed.  Patient left the clinic in stable condition.   Clinical History: MRI LUMBAR SPINE WITHOUT CONTRAST  TECHNIQUE: Multiplanar, multisequence MR imaging of  the lumbar spine was performed. No intravenous contrast was administered.  COMPARISON:  10/29/2010  FINDINGS: Segmentation:  Standard.  Alignment:  Normal.  Vertebrae: No fracture or suspicious osseous lesion. Unchanged L1 vertebral body hemangioma. Progressive L5-S1 disc degeneration with new severe disc space height loss, moderate type 1 and type 2 Modic changes, and small L5 inferior endplate Schmorl's node.  Conus medullaris and cauda equina: Conus extends to the L1 level. There is a 4 mm T2 hypointense focus along the posterior thecal sac near the midline at the L4 level, less conspicuous on some sequences compared with the prior study though likely unchanged in size given its appearance on sagittal STIR image 8. This is less clearly extradural on today's examination.  Paraspinal and other soft tissues: Unremarkable.  Disc levels:  T12-L1 through L2-3: Negative.  L3-4: Mild facet hypertrophy.  Normal disc.  No stenosis.  L4-5: Disc desiccation. Mild disc bulging and mild-to-moderate facet hypertrophy without stenosis, not significantly changed.  L5-S1: Interval left laminectomy and discectomy. Circumferential disc osteophyte complex asymmetric to the left and disc space height loss result in borderline to mild left neural foraminal stenosis without evidence of L5 nerve root compression. Patent spinal canal and neural foramina.  IMPRESSION: 1. Interval postoperative changes at L5-S1 with progressive, advanced disc degeneration. Mild left neural foraminal narrowing without spinal stenosis. 2. Mild-to-moderate facet arthrosis and mild disc bulging at L4-5 without stenosis. 3. Unchanged size of 4 mm nodule in the spinal canal at L4, less clearly extradural on today's study. A tiny meningioma or nerve sheath tumor are possibilities. Regardless, stability over nearly 7 years is consistent with a benign etiology.   Electronically Signed   By: Sebastian Ache M.D.   On: 06/26/2017 16:07   She reports that she has never smoked. She has never used smokeless tobacco. No results for input(s): HGBA1C, LABURIC in the last 8760 hours.  Objective:  VS:  HT:    WT:   BMI:     BP:127/80  HR:79bpm  TEMP:98.1 F (36.7 C)( )  RESP:98 % Physical Exam  Ortho Exam Imaging: No results found.  Past Medical/Family/Surgical/Social History: Medications & Allergies reviewed per EMR, new medications updated. There are no active problems to display for this patient.  No past medical history on file. Family History  Problem Relation Age of Onset  . Arthritis Mother   . Hypertension Mother   . Stroke Mother        two  . Hypertension Father    Past Surgical History:  Procedure Laterality Date  . SPINE SURGERY  11-16-10   L5-S1 discetomy   Social History   Occupational History  . Not on file  Tobacco Use  . Smoking status: Never Smoker  . Smokeless tobacco: Never Used  Substance and Sexual Activity  . Alcohol use: Not on file  . Drug use: Not on file  . Sexual activity: Not on file

## 2017-08-31 NOTE — Procedures (Signed)
Lumbar Diagnostic Facet Joint Nerve Block with Fluoroscopic Guidance   Patient: Marie Barr      Date of Birth: 06/24/1975 MRN: [id number] PCP: Trey SailorsPa, [facility]      Visit Date: 08/24/2017   Universal Protocol:    Date/Time: 04/10/195:32 AM  Consent Given By: the patient  Position: PRONE  Additional Comments: Vital signs were monitored before and after the procedure. Patient was prepped and draped in the usual sterile fashion. The correct patient, procedure, and site was verified.   Injection Procedure Details:  Procedure Site One Meds Administered:  Meds ordered this encounter  Medications  . bupivacaine (MARCAINE) 0.5 % (with pres) injection 3 mL  . dexamethasone (DECADRON) injection 15 mg     Laterality: Bilateral  Location/Site:  L4-L5 L5-S1  Needle size: 22 ga.  Needle type:spinal  Needle Placement: Oblique pedical  Findings:   -Comments: There was excellent flow of contrast along the articular pillars without intravascular flow.  Procedure Details: The fluoroscope beam is vertically oriented in AP and then obliqued 15 to 20 degrees to the ipsilateral side of the desired nerve to achieve the "Scotty dog" appearance.  The skin over the target area of the junction of the superior articulating process and the transverse process (sacral ala if blocking the L5 dorsal rami) was locally anesthetized with a 1 ml volume of 1% Lidocaine without Epinephrine.  The spinal needle was inserted and advanced in a trajectory view down to the target.   After contact with periosteum and negative aspirate for blood and CSF, correct placement without intravascular or epidural spread was confirmed by injecting 0.5 ml. of Isovue-250.  A spot radiograph was obtained of this image.    Next, a 0.5 ml. volume of the injectate described above was injected. The needle was then redirected to the other facet joint nerves mentioned above if needed.  Prior to the procedure,  the patient was given a Pain Diary which was completed for baseline measurements.  After the procedure, the patient rated their pain every 30 minutes and will continue rating at this frequency for a total of 5 hours.  The patient has been asked to complete the Diary and return to us by mail, fax or hand delivered as soon as possible.   Additional Comments:  The patient tolerated the procedure well Dressing: Band-Aid    Post-procedure details: Patient was observed during the procedure. Post-procedure instructions were reviewed.  Patient left the clinic in stable condition.

## 2020-01-15 IMAGING — MR MR LUMBAR SPINE W/O CM
4 of 5 series · 18 of 48 positions shown · non-contrast
Comparison: 10/29/2010

CLINICAL DATA: Low back pain for 6 months. No radicular complaints.
Prior discectomy.

EXAM:
MRI LUMBAR SPINE WITHOUT CONTRAST
TECHNIQUE: Multiplanar, multisequence MR imaging of the lumbar spine was
performed. No intravenous contrast was administered.

[Series 6: T2 · sagittal · 4.0mm · 0.73mm/px · 6 of 15 slices shown (1 of 2)]
[im 1/15]
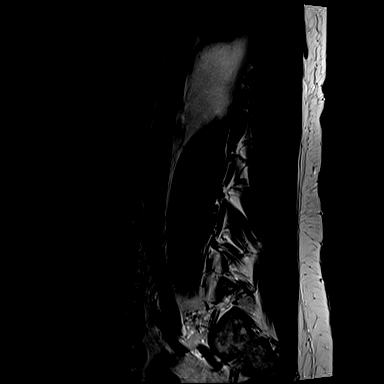
[im 3/15]
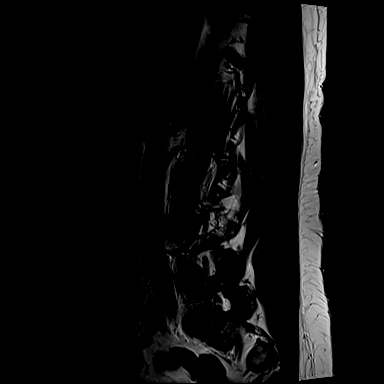
[im 6/15]
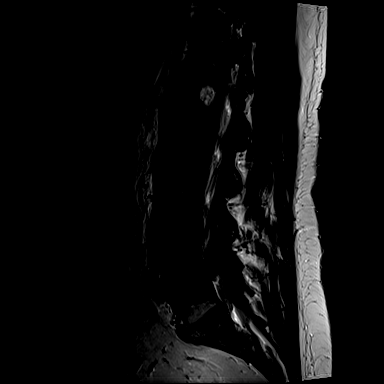
[im 9/15]
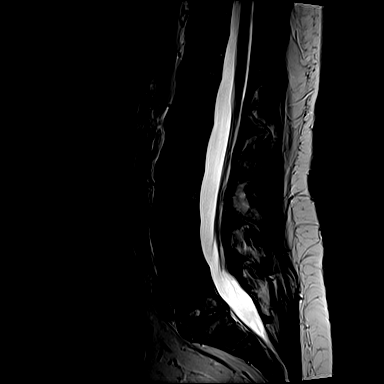
[im 12/15]
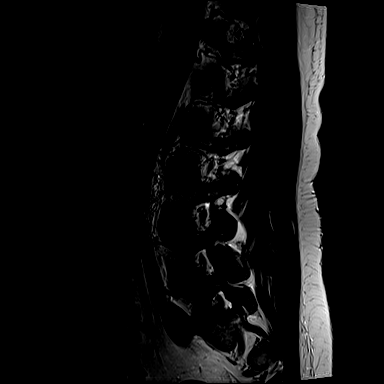
[im 15/15]
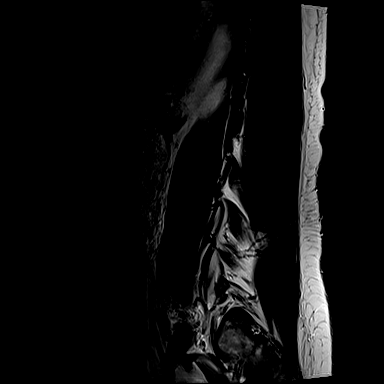

[Series 7: T1 · sagittal · 4.0mm · 0.73mm/px · 3 of 15 slices shown (1 of 2)]
[im 3/15]
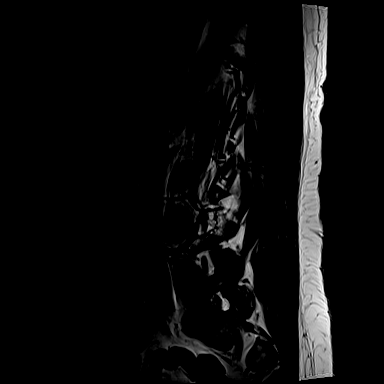
[im 9/15]
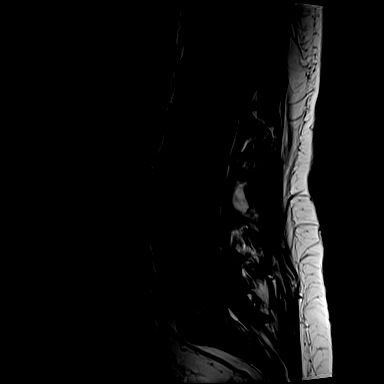
[im 15/15]
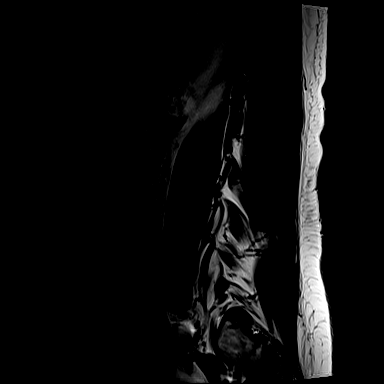

[Series 11: T1 · axial · 4.0mm · 0.28mm/px · z∈[-128,+48]mm · 3 of 41 slices shown (2 of 2)]
[im 6/41]
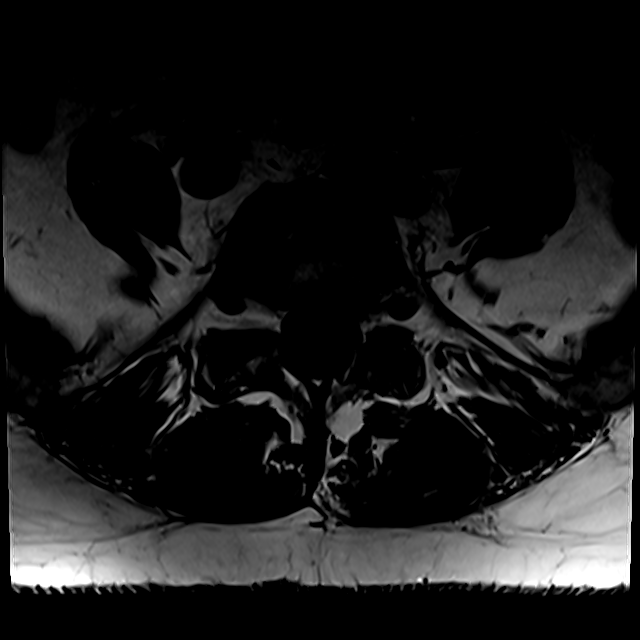
[im 21/41]
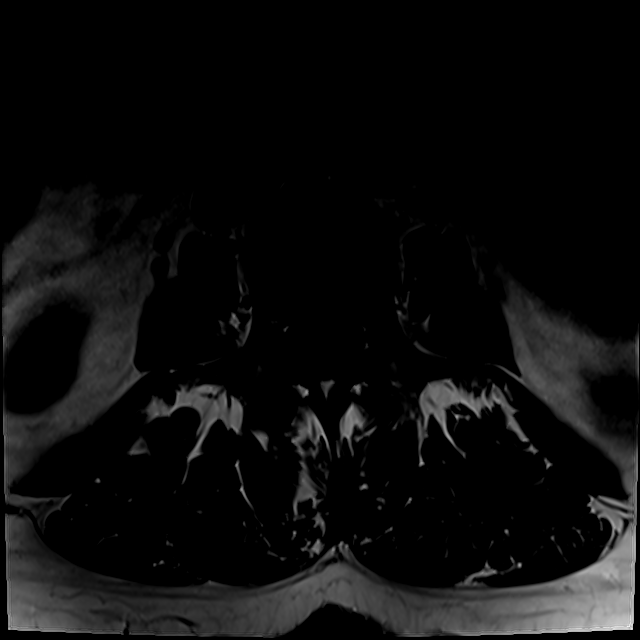
[im 35/41]
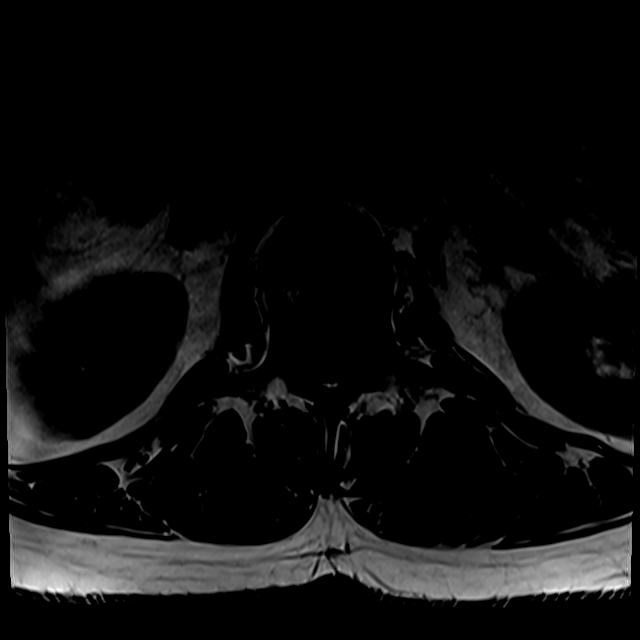

[Series 14: T2 · axial · 4.0mm · 0.28mm/px · z∈[-153,+48]mm · 6 of 41 slices shown (2 of 2)]
[im 1/41]
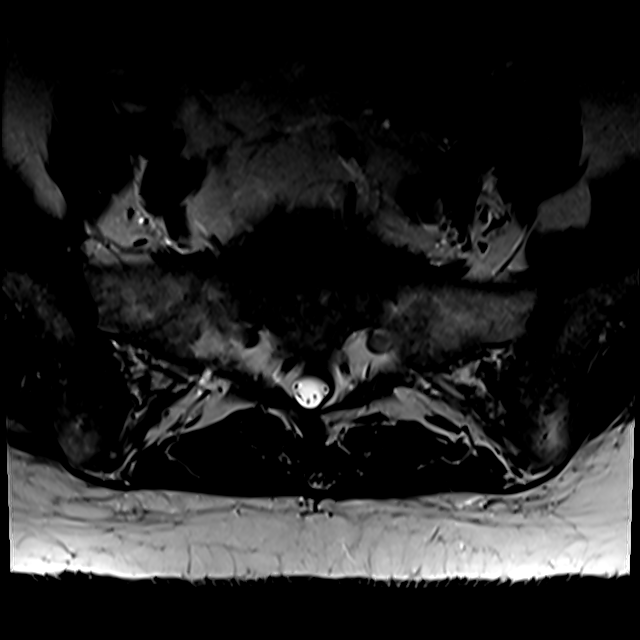
[im 6/41]
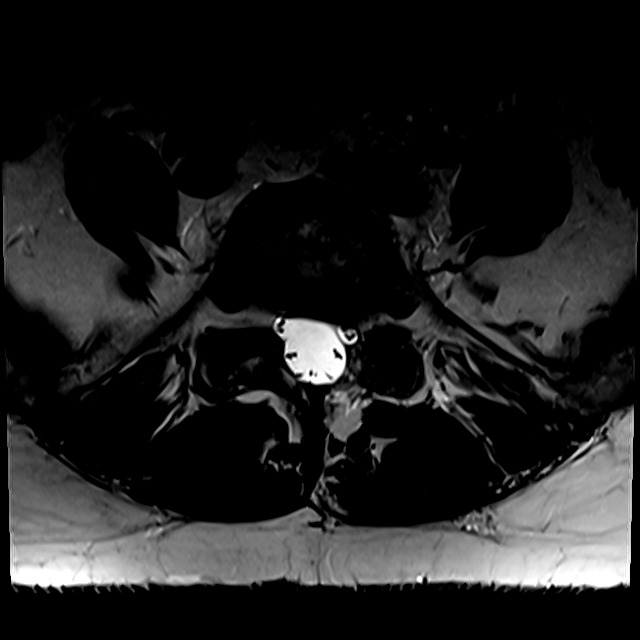
[im 12/41]
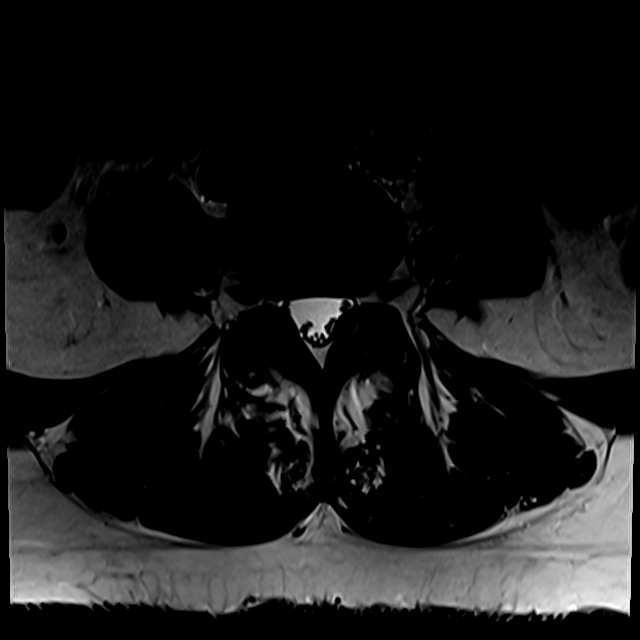
[im 18/41]
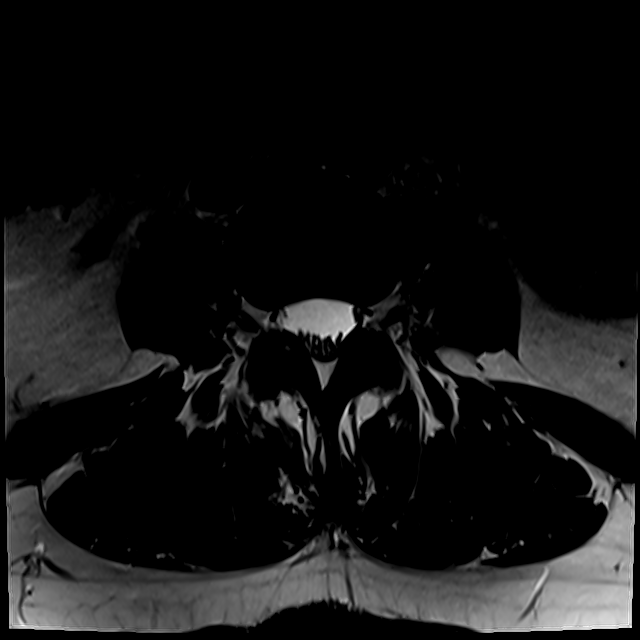
[im 21/41]
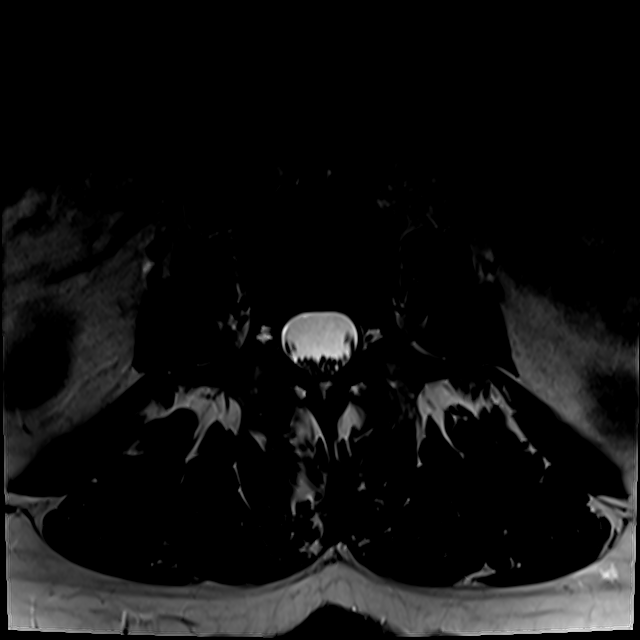
[im 35/41]
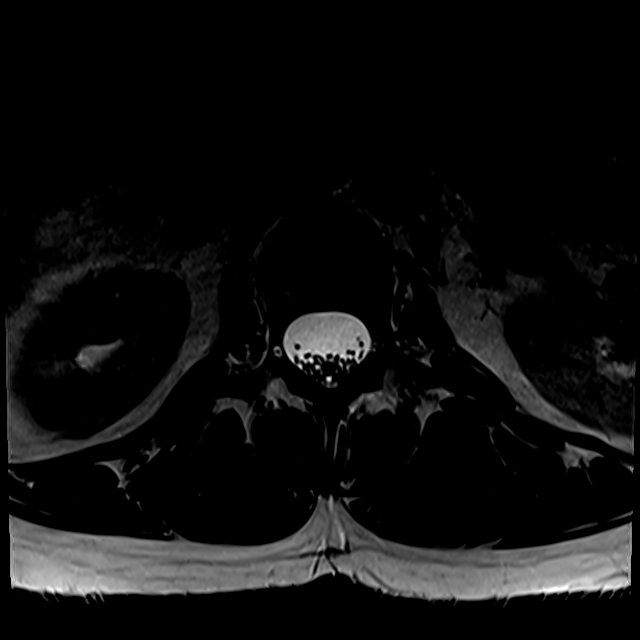

[18 of 48 positions shown; findings below may reference images not displayed]

FINDINGS: Segmentation:  Standard.

Alignment:  Normal.

Vertebrae: No fracture or suspicious osseous lesion. Unchanged L1
vertebral body hemangioma. Progressive L5-S1 disc degeneration with
new severe disc space height loss, moderate type 1 and type 2 Modic
changes, and small L5 inferior endplate Schmorl's node.

Conus medullaris and cauda equina: Conus extends to the L1 level.
There is a 4 mm T2 hypointense focus along the posterior thecal sac
near the midline at the L4 level, less conspicuous on some sequences
compared with the prior study though likely unchanged in size given
its appearance on sagittal STIR image 8. This is less clearly
extradural on today's examination.

Paraspinal and other soft tissues: Unremarkable.

Disc levels:

T12-L1 through L2-3: Negative.

L3-4: Mild facet hypertrophy.  Normal disc.  No stenosis.

L4-5: Disc desiccation. Mild disc bulging and mild-to-moderate facet
hypertrophy without stenosis, not significantly changed.

L5-S1: Interval left laminectomy and discectomy. Circumferential
disc osteophyte complex asymmetric to the left and disc space height
loss result in borderline to mild left neural foraminal stenosis
without evidence of L5 nerve root compression. Patent spinal canal
and neural foramina.
IMPRESSION: 1. Interval postoperative changes at L5-S1 with progressive,
advanced disc degeneration. Mild left neural foraminal narrowing
without spinal stenosis.
2. Mild-to-moderate facet arthrosis and mild disc bulging at L4-5
without stenosis.
3. Unchanged size of 4 mm nodule in the spinal canal at L4, less
clearly extradural on today's study. A tiny meningioma or nerve
sheath tumor are possibilities. Regardless, stability over nearly 7
years is consistent with a benign etiology.

## 2020-01-15 IMAGING — MR MR SHOULDER*R* W/O CM
6 series · 39 of 40 positions shown · non-contrast
Comparison: Shoulder x-rays dated June 15, 2017.

CLINICAL DATA: Six-month history of shoulder pain along the medial
scapula, concern for muscle injury.

EXAM:
MRI OF THE RIGHT SHOULDER WITHOUT CONTRAST
TECHNIQUE: Multiplanar, multisequence MR imaging of the shoulder was performed.
No intravenous contrast was administered.

[Series 8: T1 · axial · right · 4.0mm · 0.69mm/px · z∈[-56,+104]mm · 5 of 35 slices shown (1 of 3)]
[im 1/35]
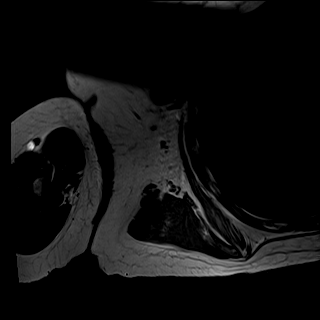
[im 9/35]
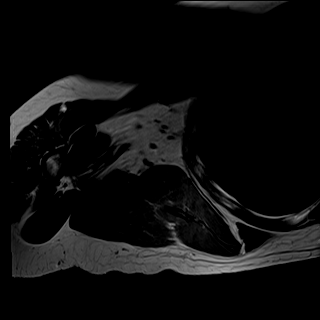
[im 18/35]
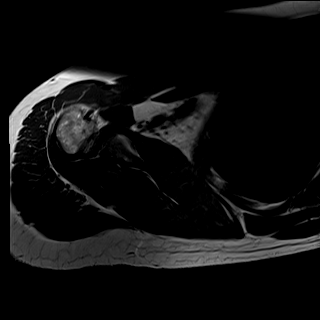
[im 26/35]
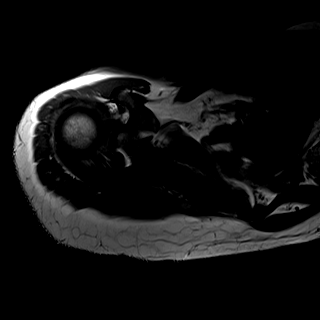
[im 35/35]
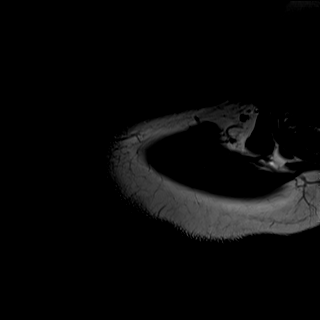

[Series 11: STIR · oblique · right · 4.0mm · 0.62mm/px · 6 of 30 slices shown (1 of 2)]
[im 1/30]
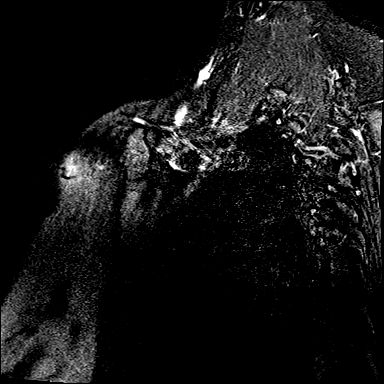
[im 6/30]
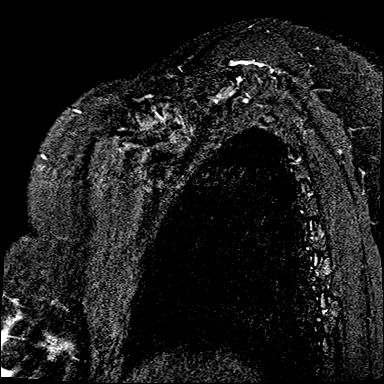
[im 12/30]
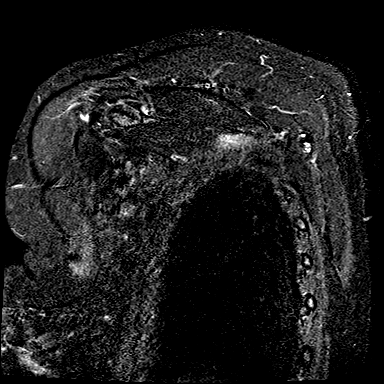
[im 18/30]
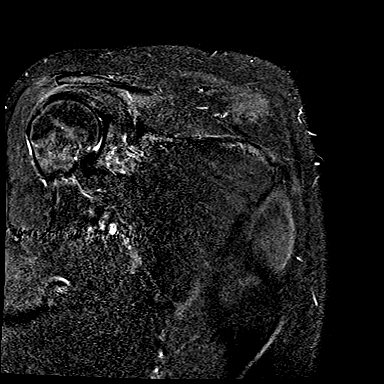
[im 24/30]
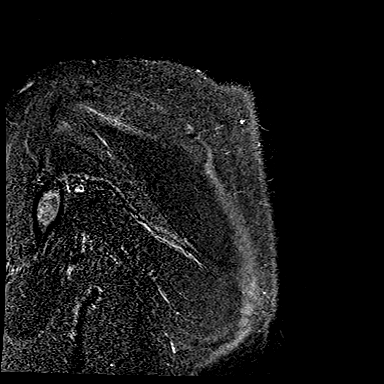
[im 30/30]
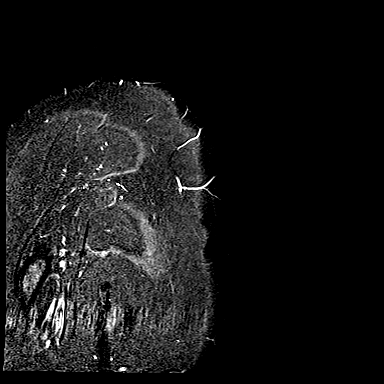

[Series 13: STIR · sagittal · right · 4.0mm · 0.62mm/px · 6 of 39 slices shown (2 of 2)]
[im 1/39]
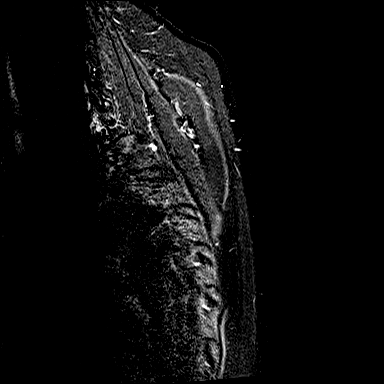
[im 7/39]
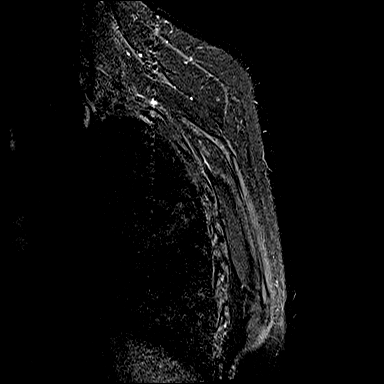
[im 13/39]
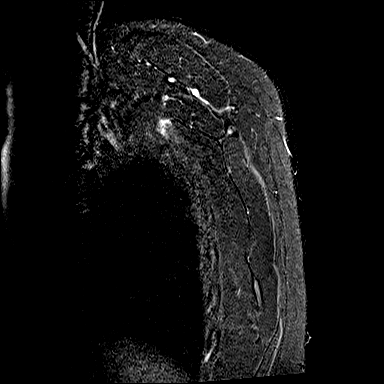
[im 20/39]
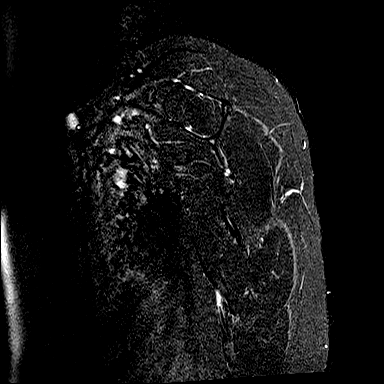
[im 26/39]
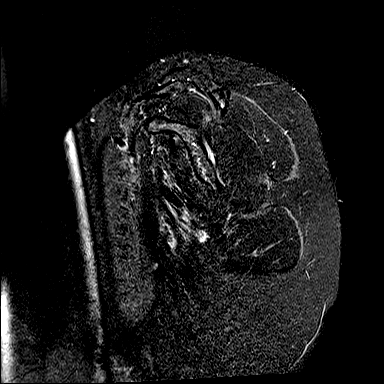
[im 32/39]
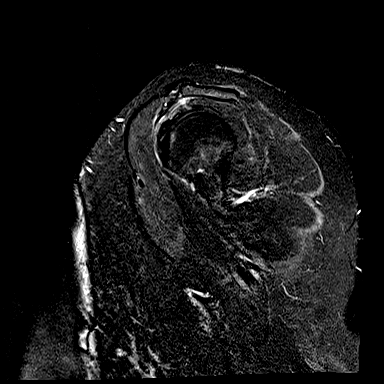

[Series 14: T2 fat-sat · axial · right · 4.0mm · 0.69mm/px · z∈[-88,+105]mm · 8 of 42 slices shown]
[im 1/42]
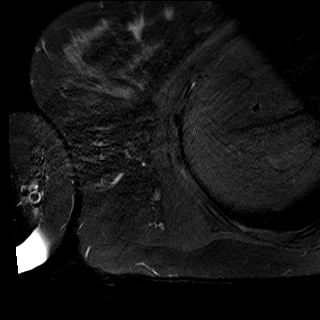
[im 6/42]
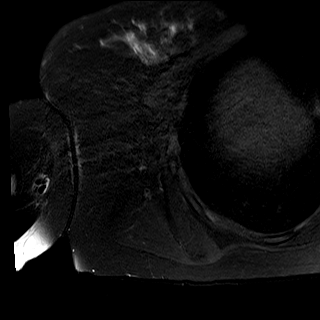
[im 12/42]
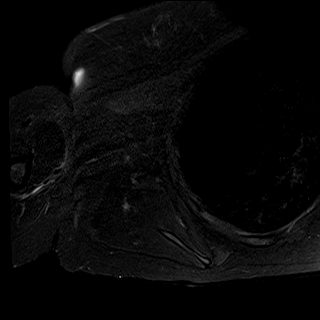
[im 18/42]
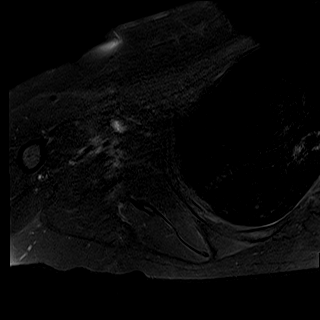
[im 24/42]
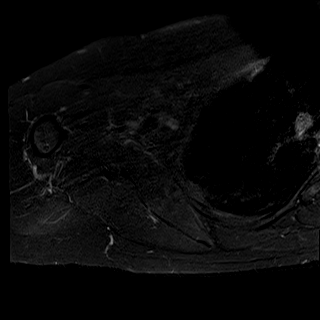
[im 30/42]
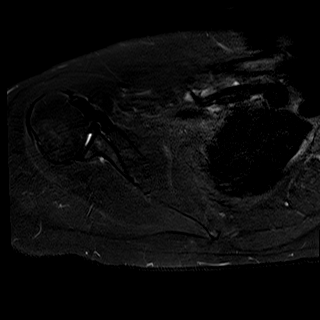
[im 36/42]
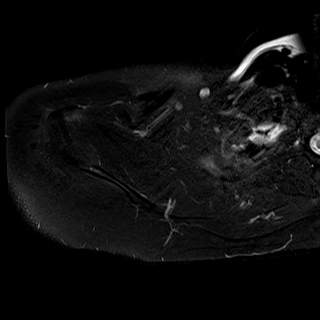
[im 42/42]
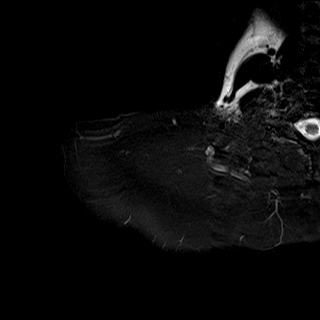

[Series 15: T1 · oblique · right · 4.0mm · 0.81mm/px · 6 of 30 slices shown (2 of 3)]
[im 1/30]
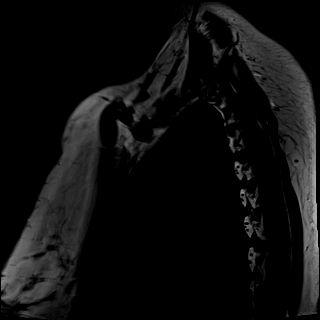
[im 6/30]
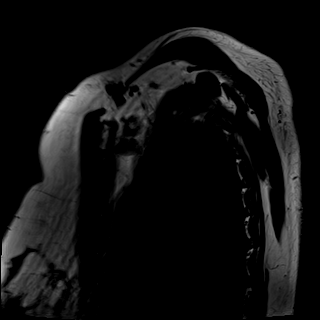
[im 12/30]
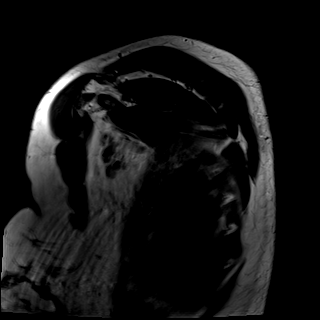
[im 18/30]
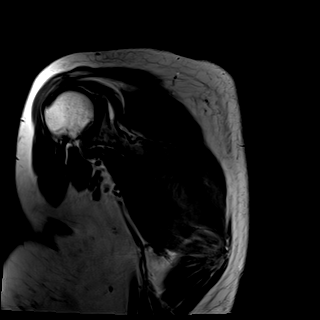
[im 24/30]
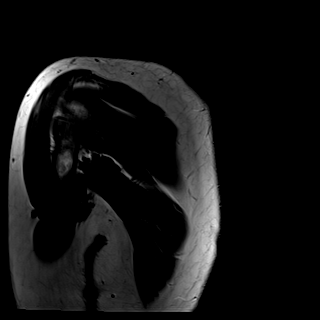
[im 30/30]
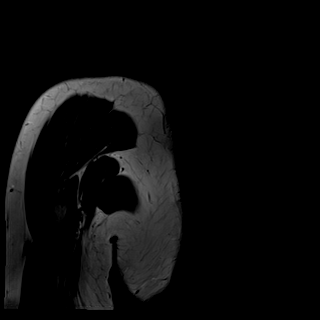

[Series 16: T1 · axial · right · 4.0mm · 0.57mm/px · z∈[-88,+105]mm · 8 of 42 slices shown (3 of 3)]
[im 1/42]
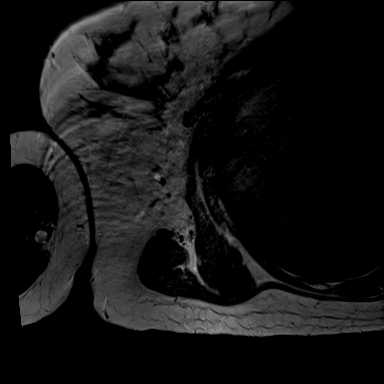
[im 6/42]
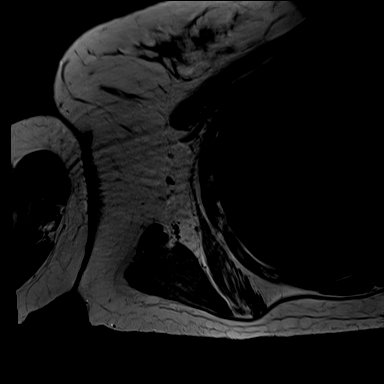
[im 12/42]
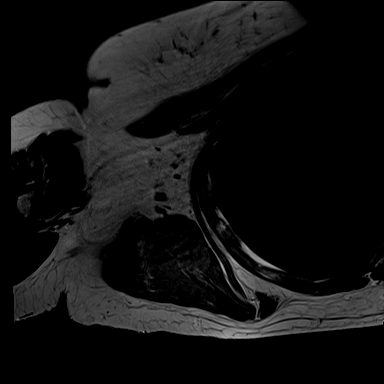
[im 18/42]
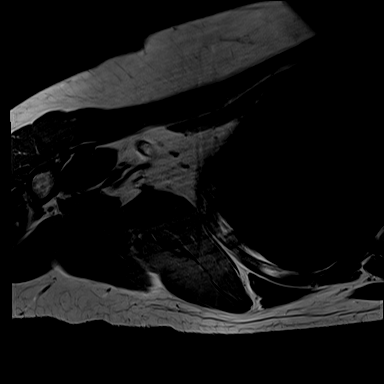
[im 24/42]
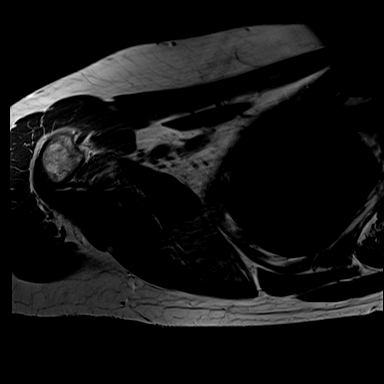
[im 30/42]
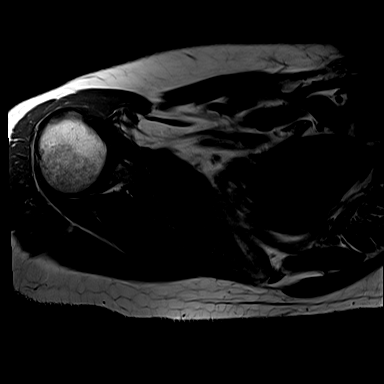
[im 36/42]
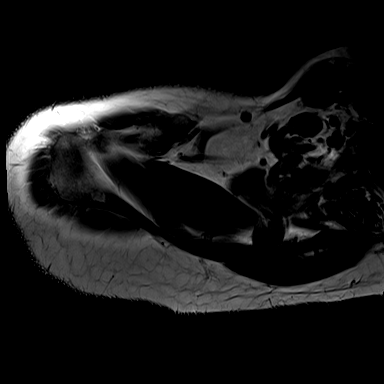
[im 42/42]
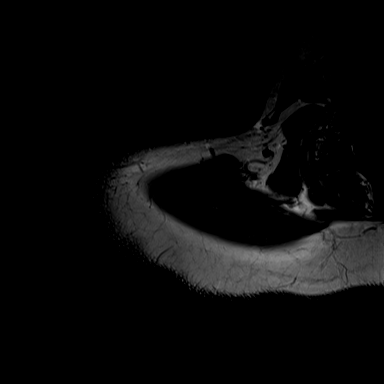

[39 of 40 positions shown; findings below may reference images not displayed]

FINDINGS: Rotator cuff:  Intact rotator cuff.

Muscles: No atrophy or abnormal signal of the muscles of the rotator
cuff. Normal signal of the rhomboid major and minor muscles. There
is mild edema in the proximal serratus anterior muscle.

Biceps long head:  Intact and normally located.

Acromioclavicular Joint: Normal acromioclavicular joint. Type II
acromion. No subacromial/subdeltoid bursal fluid.

Glenohumeral Joint: No joint effusion. No chondral defect.

Labrum: Grossly intact, but evaluation is limited by lack of
intraarticular fluid.

Bones:  No marrow abnormality, fracture or dislocation.

Other: None.
IMPRESSION: 1. Faint edema in the proximal serrated anterior muscle,
nonspecific, but could reflect mild strain.
2. Normal rhomboid major and minor muscles.

## 2021-01-12 ENCOUNTER — Ambulatory Visit (INDEPENDENT_AMBULATORY_CARE_PROVIDER_SITE_OTHER): Payer: 59

## 2021-01-12 ENCOUNTER — Other Ambulatory Visit: Payer: Self-pay

## 2021-01-12 ENCOUNTER — Ambulatory Visit (INDEPENDENT_AMBULATORY_CARE_PROVIDER_SITE_OTHER): Payer: 59 | Admitting: Physician Assistant

## 2021-01-12 ENCOUNTER — Encounter: Payer: Self-pay | Admitting: Physician Assistant

## 2021-01-12 VITALS — Ht 69.0 in | Wt 209.0 lb

## 2021-01-12 DIAGNOSIS — M25561 Pain in right knee: Secondary | ICD-10-CM

## 2021-01-12 MED ORDER — METHYLPREDNISOLONE ACETATE 40 MG/ML IJ SUSP
40.0000 mg | INTRAMUSCULAR | Status: AC | PRN
  Administered 2021-01-12: 40 mg via INTRA_ARTICULAR

## 2021-01-12 MED ORDER — LIDOCAINE HCL 1 % IJ SOLN
4.0000 mL | INTRAMUSCULAR | Status: AC | PRN
  Administered 2021-01-12: 4 mL

## 2021-01-12 NOTE — Progress Notes (Signed)
Office Visit Note   Patient: Marie Barr           Date of Birth: [birth year]           MRN: [id number] Visit Date: 01/12/2021              Requested by: [facility] [address] [place],  [place] [address] PCP: [facility]   Assessment & Plan: Visit Diagnoses:  1. Acute pain of right knee     Plan: We will obtain an MRI to rule out internal derangement of her right knee due to the fact that she has mechanical symptoms and has a mechanical block of the knee unable to flex it beyond 90 degrees even after injection.  Have her follow-up after the MRI to go over results discuss further treatment.  Keep her out of work until follow-up after the MRI and at that point time reevaluate her work status.  Questions were encouraged and answered at length today.  She will ice the knee relative rest and continue Mobic.  Follow-Up Instructions: Return After MRI.   Orders:  Orders Placed This Encounter  Procedures   Large Joint Inj   XR KNEE 3 VIEW RIGHT   No orders of the defined types were placed in this encounter.     Procedures: Large Joint Inj: R knee on 01/12/2021 5:27 PM Indications: pain Details: 22 G 1.5 in needle, superolateral approach  Arthrogram: No  Medications: 4 mL lidocaine 1 %; 40 mg methylPREDNISolone acetate 40 MG/ML Aspirate: 11 mL yellow Outcome: tolerated well, no immediate complications Procedure, treatment alternatives, risks and benefits explained, specific risks discussed. Consent was given by the patient. Immediately prior to procedure a time out was called to verify the correct patient, procedure, equipment, support staff and site/side marked as required. Patient was prepped and draped in the usual sterile fashion.      Clinical Data: No additional findings.   Subjective: Chief Complaint  Patient presents with   Right Knee - Pain    HPI Marie Barr is a pleasant 45 year old female  comes in today for right knee pain.  She states she is had right knee pain now for 2 weeks and unable to bear weight on the knee.  She is also unable to bend the knee past 90 degrees.  She notes some stretching the day before but otherwise no injury.  She has tried Tylenol and Aleve without any real relief.  She has been a unable to work due to the pain in the knee.  She feels knee pain overall is becoming progressively worse.  She has had painful popping in the knee.  Nondiabetic. Review of Systems See HPI otherwise negative or noncontributory  Objective: Vital Signs: Ht 5\' 9"  (1.753 m)   Wt 209 lb (94.8 kg)   BMI 30.86 kg/m   Physical Exam General: Well-developed well-nourished female no acute distress mood and affect appropriate. Psych: Alert and oriented x3 Ortho Exam Left knee good range of motion without pain.  Right knee full extension.  Active and passive flexion only to 90 degrees.  Tenderness along lateral joint line of the right knee.  Unable to obtain a full exam of the knee due to pain and guarding.  Anterior drawer is negative.  No gross instability valgus varus stressing right knee.  Slight effusion right knee. Specialty Comments:  No specialty comments available.  Imaging: XR KNEE 3 VIEW RIGHT  Result Date: 01/12/2021 Right  knee 2 views: No acute fracture.  Mild to moderate patellofemoral changes with periarticular osteophytes.  Otherwise knee is well-preserved.  Knee is well located.    PMFS History: There are no problems to display for this patient.  No past medical history on file.  Family History  Problem Relation Age of Onset   Arthritis Mother    Hypertension Mother    Stroke Mother        two   Hypertension Father     Past Surgical History:  Procedure Laterality Date   SPINE SURGERY  11-16-10   L5-S1 discetomy   Social History   Occupational History   Not on file  Tobacco Use   Smoking status: Never   Smokeless tobacco: Never  Substance and Sexual  Activity   Alcohol use: Not on file   Drug use: Not on file   Sexual activity: Not on file

## 2021-01-13 ENCOUNTER — Telehealth: Payer: Self-pay | Admitting: Physician Assistant

## 2021-01-13 NOTE — Telephone Encounter (Signed)
Ins company calling on behalf of the pt wanting to know if an authorization code for the MRI ordered has been completed. Pt saw Bronson Curb on 01/12/21. The best call back number is [phone].

## 2021-01-14 NOTE — Telephone Encounter (Signed)
Will work on it when I get to todays date

## 2021-01-14 NOTE — Addendum Note (Signed)
Addended by: Barbette Or on: 01/14/2021 08:38 AM   Modules accepted: Orders

## 2021-01-17 ENCOUNTER — Other Ambulatory Visit: Payer: Self-pay

## 2021-01-17 ENCOUNTER — Ambulatory Visit
Admission: RE | Admit: 2021-01-17 | Discharge: 2021-01-17 | Disposition: A | Discharge status: Home / Self Care | Payer: 59 | Source: Ambulatory Visit | Attending: Physician Assistant | Admitting: Physician Assistant

## 2021-01-17 DIAGNOSIS — M25561 Pain in right knee: Secondary | ICD-10-CM

## 2021-01-19 ENCOUNTER — Telehealth: Payer: Self-pay | Admitting: Orthopaedic Surgery

## 2021-01-19 ENCOUNTER — Ambulatory Visit (INDEPENDENT_AMBULATORY_CARE_PROVIDER_SITE_OTHER): Payer: 59 | Admitting: Orthopaedic Surgery

## 2021-01-19 ENCOUNTER — Encounter: Payer: Self-pay | Admitting: Orthopaedic Surgery

## 2021-01-19 DIAGNOSIS — S83241D Other tear of medial meniscus, current injury, right knee, subsequent encounter: Secondary | ICD-10-CM

## 2021-01-19 NOTE — Telephone Encounter (Signed)
Pt called and decided to go with surgery. She want Dr. Magnus Ivan to proceed with adding her to schedule and have Sherri call to set surgery date. Pt phone number is [phone].

## 2021-01-19 NOTE — Progress Notes (Addendum)
The patient comes in today to go over MRI of her right knee.  She had an acute injury to that right knee and it was locked in a flexed position.  She feels much better this week after having a steroid injection in the knee and her pain is minimal.  Examination of the right knee does show some pain when hyperflexing the knee and with rotation of the tibia on the femur.  The MRI does show meniscal root tear with extrusion of the meniscus.  She does have significant patellofemoral arthritic changes.  Also of note is incidental finding of a large enchondroma in the distal femur.  It is not well seen on plain films but is definitely seen on the MRI.  She has not had any previous knee pain prior to this recent injury 3 weeks ago.  The MRI does not show worrisome findings of cortical bone and the plain films do not either.  I talked her in length using a knee model explaining what the meniscus tear means.  I do feel that she will end up with an arthroscopic intervention but if we can avoid that we will.  She will be careful with pivoting activities and I would like to reevaluate her in just 2 weeks.  If things worsen and she develops more mechanical symptoms again, we would definitely recommend an arthroscopic intervention and I explained in detail what that involves as well as the risk and benefits.  As far as the enchondroma goes, we would probably repeat a MRI in 3 to 6 months for surveillance.  Addendum: The patient did call back later today stating that she would rather [place] go ahead and set her up for a right knee arthroscopy with partial medial meniscectomy.  I did explain to her that I would recommend the surgery if she was significantly symptomatic and apparently as the day has gone on today she is having a lot of pain with pivoting with that knee and given the MRI findings of the significant meniscal tear is opting now to proceed with a knee arthroscopy for the right knee.  I think this is absolutely  warranted and reasonable given her continued mechanical symptoms and the MRI findings showing a meniscal tear.  We will be in touch about getting the surgery scheduled.  I did explain to her in detail what the surgery involves.

## 2021-01-29 NOTE — Telephone Encounter (Signed)
I called patient and scheduled surgery. 

## 2021-02-03 ENCOUNTER — Ambulatory Visit: Payer: 59 | Admitting: Orthopaedic Surgery

## 2021-02-10 ENCOUNTER — Telehealth: Payer: Self-pay | Admitting: Orthopaedic Surgery

## 2021-02-10 NOTE — Telephone Encounter (Signed)
Pt called and has some questions about how long she will be out of work.   CB [phone]

## 2021-02-10 NOTE — Telephone Encounter (Signed)
LMOM for patient letting her know we were returning her call

## 2021-02-23 ENCOUNTER — Telehealth: Payer: Self-pay | Admitting: Orthopaedic Surgery

## 2021-02-23 NOTE — Telephone Encounter (Signed)
How long will you have her out of work? And what is her sx procedure?

## 2021-02-23 NOTE — Telephone Encounter (Signed)
02-26-21 

## 2021-02-23 NOTE — Telephone Encounter (Signed)
Pt called requesting a called back. Pt states she needs a letter for her employer stating what type of surgery she will have and her estimated recovery time. Please call pt when ready for pick up at [phone].

## 2021-02-23 NOTE — Telephone Encounter (Signed)
What date is her sx on?

## 2021-02-24 ENCOUNTER — Telehealth: Payer: Self-pay | Admitting: Orthopaedic Surgery

## 2021-02-24 NOTE — Telephone Encounter (Signed)
Letter completed.

## 2021-02-24 NOTE — Telephone Encounter (Signed)
Lvm advising pt.

## 2021-02-24 NOTE — Telephone Encounter (Signed)
Received medical records release form $25.00 cash and FMLA paperwork from patient

## 2021-02-24 NOTE — Telephone Encounter (Signed)
Letter placed at the front desk.

## 2021-02-26 ENCOUNTER — Other Ambulatory Visit: Payer: Self-pay | Admitting: Orthopaedic Surgery

## 2021-02-26 DIAGNOSIS — S83231D Complex tear of medial meniscus, current injury, right knee, subsequent encounter: Secondary | ICD-10-CM | POA: Diagnosis not present

## 2021-02-26 MED ORDER — HYDROCODONE-ACETAMINOPHEN 5-325 MG PO TABS: 1.0000 | ORAL_TABLET | Freq: Four times a day (QID) | ORAL | 0 refills | Status: AC | PRN

## 2021-03-05 ENCOUNTER — Ambulatory Visit (INDEPENDENT_AMBULATORY_CARE_PROVIDER_SITE_OTHER): Payer: 59 | Admitting: Orthopaedic Surgery

## 2021-03-05 DIAGNOSIS — M25461 Effusion, right knee: Secondary | ICD-10-CM

## 2021-03-05 DIAGNOSIS — S83241D Other tear of medial meniscus, current injury, right knee, subsequent encounter: Secondary | ICD-10-CM

## 2021-03-05 DIAGNOSIS — Z9889 Other specified postprocedural states: Secondary | ICD-10-CM

## 2021-03-05 MED ORDER — TIZANIDINE HCL 4 MG PO TABS: 4.0000 mg | ORAL_TABLET | Freq: Three times a day (TID) | ORAL | 1 refills | Status: AC | PRN

## 2021-03-05 MED ORDER — LIDOCAINE HCL 1 % IJ SOLN
3.0000 mL | INTRAMUSCULAR | Status: AC | PRN
  Administered 2021-03-05: 3 mL

## 2021-03-05 MED ORDER — METHYLPREDNISOLONE ACETATE 40 MG/ML IJ SUSP
40.0000 mg | INTRAMUSCULAR | Status: AC | PRN
  Administered 2021-03-05: 40 mg via INTRA_ARTICULAR

## 2021-03-05 NOTE — Progress Notes (Signed)
Office Visit Note   Patient: Marie Barr           Date of Birth: August 22, 1975           MRN: [id number] Visit Date: 03/05/2021              Requested by: [facility] [address] [place],  [place] [address] PCP: [facility]   Assessment & Plan: Visit Diagnoses:  1. Acute medial meniscus tear of right knee, subsequent encounter   2. Effusion, right knee   3. Status post arthroscopy of right knee     Plan: I did go over arthroscopy pictures with her showing her the extent of wear in her right knee.  I then aspirated at least 30 cc of hematoma fluid from her knee.  There is still an abundant amount of fluid left in the knee but I could not aspirate.  I did place a steroid injection into her right knee.  I will put her on a muscle relaxant and would like to see her back in only just 2 weeks to assess her knee again and look at her motion.  That point we can determine whether or not she may need physical therapy.  Also gave her handout for considering hyaluronic acid at some point for that knee given the osteoarthritis were seen.  Follow-Up Instructions: Return in about 2 weeks (around 03/19/2021).   Orders:  Orders Placed This Encounter  Procedures   Large Joint Inj   No orders of the defined types were placed in this encounter.     Procedures: Large Joint Inj: R knee on 03/05/2021 4:20 PM Indications: diagnostic evaluation and pain Details: 22 G 1.5 in needle, superolateral approach  Arthrogram: No  Medications: 3 mL lidocaine 1 %; 40 mg methylPREDNISolone acetate 40 MG/ML Outcome: tolerated well, no immediate complications Procedure, treatment alternatives, risks and benefits explained, specific risks discussed. Consent was given by the patient. Immediately prior to procedure a time out was called to verify the correct patient, procedure, equipment, support staff and site/side marked as required. Patient was  prepped and draped in the usual sterile fashion.      Clinical Data: No additional findings.   Subjective: Chief Complaint  Patient presents with   Right Knee - Follow-up  The patient is 1 week status post a right knee arthroscopy.  We found significant cartilage wear at the medial compartment of her knee and the patellofemoral joint.  There was also meniscal root tear.  She is still ambulate with crutches and having a hard time walking and bending her knee.  She denies any calf pain.  HPI  Review of Systems There is no fever, chills, nausea, vomiting  Objective: Vital Signs: There were no vitals taken for this visit.  Physical Exam She is alert and orient x3 and in no acute distress Ortho Exam Examination of her right operative knee shows a large effusion.  There is pain throughout the knee.  Her calf is soft.  The sutures have been removed from her incisions.  Her medial incision is very painful but there is no evidence of infection. Specialty Comments:  No specialty comments available.  Imaging: No results found.   PMFS History: There are no problems to display for this patient.  No past medical history on file.  Family History  Problem Relation Age of Onset   Arthritis Mother    Hypertension Mother    Stroke Mother  two   Hypertension Father     Past Surgical History:  Procedure Laterality Date   SPINE SURGERY  11-16-10   L5-S1 discetomy   Social History   Occupational History   Not on file  Tobacco Use   Smoking status: Never   Smokeless tobacco: Never  Substance and Sexual Activity   Alcohol use: Not on file   Drug use: Not on file   Sexual activity: Not on file

## 2021-03-19 ENCOUNTER — Encounter: Payer: Self-pay | Admitting: Physician Assistant

## 2021-03-19 ENCOUNTER — Ambulatory Visit (INDEPENDENT_AMBULATORY_CARE_PROVIDER_SITE_OTHER): Payer: 59 | Admitting: Physician Assistant

## 2021-03-19 DIAGNOSIS — M1711 Unilateral primary osteoarthritis, right knee: Secondary | ICD-10-CM

## 2021-03-19 NOTE — Progress Notes (Signed)
HPI: Marie Barr returns today for follow-up of her right knee status post cortisone injection 03/05/2021.  She states she got about 70% relief from the injection for about 5 to 6 days.  Feels the knee is still better than it was before the injection but having problems with going up and down stairs.  She states she is very fearful of going downstairs.  She has increased pain mostly medial aspect of the right knee.  She states the leg feels weak.  She is not taking anything for the pain.  Physical exam: General well-developed well-nourished female no acute distress ambulates without any assistive device.  Right knee overall good range of motion the knee no abnormal warmth or erythema.  Port sites are well-healed.  Impression: Right knee OA  Plan: Recommend working on quad strengthening giving the significant cartilage wear that was seen on knee arthroscopy involving the patellofemoral joint and also her medial compartment.  Also recommend supplemental injection.  Questions were encouraged and answered at length.  We will see her back once the supplemental injection has been approved.  Questions were encouraged and answered at length today.

## 2021-03-20 ENCOUNTER — Telehealth: Payer: Self-pay

## 2021-03-20 NOTE — Telephone Encounter (Signed)
Please get auth for right knee gel injection-gil pt 

## 2021-03-20 NOTE — Addendum Note (Signed)
Addended by: Fredda Hammed L on: 03/20/2021 11:10 AM   Modules accepted: Orders

## 2021-03-23 NOTE — Telephone Encounter (Signed)
Noted  

## 2021-04-02 ENCOUNTER — Telehealth: Payer: Self-pay

## 2021-04-02 ENCOUNTER — Other Ambulatory Visit: Payer: Self-pay | Admitting: Family Medicine

## 2021-04-02 ENCOUNTER — Telehealth: Payer: Self-pay | Admitting: Orthopaedic Surgery

## 2021-04-02 ENCOUNTER — Ambulatory Visit: Payer: 59 | Attending: Physician Assistant | Admitting: Physical Therapy

## 2021-04-02 ENCOUNTER — Encounter: Payer: Self-pay | Admitting: Physical Therapy

## 2021-04-02 ENCOUNTER — Other Ambulatory Visit: Payer: Self-pay

## 2021-04-02 DIAGNOSIS — R2689 Other abnormalities of gait and mobility: Secondary | ICD-10-CM | POA: Diagnosis present

## 2021-04-02 DIAGNOSIS — R600 Localized edema: Secondary | ICD-10-CM | POA: Insufficient documentation

## 2021-04-02 DIAGNOSIS — M6281 Muscle weakness (generalized): Secondary | ICD-10-CM | POA: Insufficient documentation

## 2021-04-02 DIAGNOSIS — M1711 Unilateral primary osteoarthritis, right knee: Secondary | ICD-10-CM | POA: Diagnosis not present

## 2021-04-02 DIAGNOSIS — Z1231 Encounter for screening mammogram for malignant neoplasm of breast: Secondary | ICD-10-CM

## 2021-04-02 NOTE — Patient Instructions (Signed)
Access Code: [email or web address] URL: [email or web address] Date: 04/02/2021 Prepared by: Raynelle Fanning  Exercises Supine Quadricep Sets - 2 x daily - 7 x weekly - 3 sets - 15 reps - 5 sec hold Standing Terminal Knee Extension at Wall with Ball - 2 x daily - 7 x weekly - 3 sets - 10 reps - 5 sec hold Supine Short Arc Quad - 2 x daily - 7 x weekly - 3 sets - 10 reps - 5 sec hold Sidelying Hip Flexion - 2 x daily - 7 x weekly - 2-3 sets - 10 reps Supine Active Straight Leg Raise - 2 x daily - 7 x weekly - 2-3 sets - 10 reps Standing Heel Raise - 2 x daily - 7 x weekly - 2 sets - 10 reps Supine Hamstring Stretch with Strap - 2 x daily - 7 x weekly - 1 sets - 3 reps - 60 sec hold

## 2021-04-02 NOTE — Therapy (Signed)
[facility] [address]  [address] [place], [place], [address] Phone: [phone]   Fax:  [phone]  Physical Therapy Evaluation  Patient Details  Name: Anuja Manka MRN: [id number] Date of Birth: 09-14-1975 Referring Provider (PT): Richardean Canal PA-C [facility] - surgeon)   Encounter Date: 04/02/2021   PT End of Session - 04/02/21 0850     Visit Number 1    Date for PT Re-Evaluation 05/14/21    Authorization Type Aetna    PT Start Time 0850    PT Stop Time 0931    PT Time Calculation (min) 41 min    Activity Tolerance Patient tolerated treatment well    Behavior During Therapy [facility] for tasks assessed/performed             History reviewed. No pertinent past medical history.  Past Surgical History:  Procedure Laterality Date   SPINE SURGERY  11-16-10   L5-S1 discetomy    There were no vitals filed for this visit.    Subjective Assessment - 04/02/21 0853     Subjective Patient had arthroscopic surgery 02/26/21 for torn medial meniscus. She now has pain in proximal tibia when she is walking. Scheduled to have gel injection by next week.    Pertinent History lumbar surgery 2010, arthroscope 02/26/21    How long can you sit comfortably? not limited    How long can you stand comfortably? not limited    How long can you walk comfortably? not at all    Diagnostic tests MRI - medial meniscus tear post horn; patellofemoral deg changes/patella alta; med compartment chondrosis    Patient Stated Goals to get rid of pain and be able to walk normally again    Currently in Pain? Yes    Pain Score 2     Pain Location Knee    Pain Orientation Right    Pain Descriptors / Indicators Sore    Pain Type Surgical pain    Pain Onset More than a month ago    Pain Frequency Intermittent    Aggravating Factors  walking    Pain Relieving Factors rest    Effect of Pain on Daily Activities works in retail, cannot walk normally                 [facility] PT Assessment - 04/02/21 0001       Assessment   Medical Diagnosis primary OA right knee    Referring Provider (PT) Richardean Canal PA-C   [facility] - surgeon   Onset Date/Surgical Date 02/26/21    Hand Dominance Left    Prior Therapy no      Precautions   Precautions None      Restrictions   Weight Bearing Restrictions No      Balance Screen   Has the patient fallen in the past 6 months No    Has the patient had a decrease in activity level because of a fear of falling?  No    Is the patient reluctant to leave their home because of a fear of falling?  No      Home Environment   Living Environment Private residence    Living Arrangements Spouse/significant other    Type of Home House    Home Layout Two level    Additional Comments step to gait descending stairs      Prior Function   Level of Independence Independent    Vocation Full time employment  Vocation Requirements on feet all day    Leisure walking      Observation/Other Assessments   Focus on Therapeutic Outcomes (FOTO)  FS= 42 (predicted 66)      Observation/Other Assessments-Edema    Edema Circumferential      Circumferential Edema   Circumferential - Right 45.5 cm    Circumferential - Left  42      Posture/Postural Control   Posture Comments atrophy right calf; edema right knee      ROM / Strength   AROM / PROM / Strength AROM;Strength      AROM   AROM Assessment Site Knee    Right/Left Knee Right    Right Knee Extension 0    Right Knee Flexion 135      PROM   PROM Assessment Site --    Right/Left Knee --      Strength   Overall Strength Comments heel raise right PF <5 reps; left 10 reps; right hip flex 4/5, ext 4+/5, ABD 5/5; right ankle DF 5/5    Strength Assessment Site Knee    Right/Left Knee Right    Right Knee Flexion 5/5    Right Knee Extension 4-/5      Flexibility   Soft Tissue Assessment /Muscle Length yes    Hamstrings mild tightness right    Quadriceps  WNL    ITB mild tightness right    Piriformis WNL      Palpation   Patella mobility Hypermobile bil    Palpation comment tender at right medial joint line, tibial plateu and distal patella      Ambulation/Gait   Ambulation/Gait Yes    Ambulation/Gait Assistance 7: Independent    Ambulation Distance (Feet) 60 Feet    Assistive device None    Gait Pattern Step-through pattern;Decreased step length - left;Decreased stance time - right    Ambulation Surface Level    Gait Comments poor eccentric quad control right in terminal swing                        Objective measurements completed on examination: See above findings.                PT Education - 04/02/21 0939     Education Details HEP (variations of TKE and SLR provided for pt comfort); discussed POC    Person(s) Educated Patient    Methods Explanation;Demonstration;Handout    Comprehension Verbalized understanding;Returned demonstration                 PT Long Term Goals - 04/02/21 0949       PT LONG TERM GOAL #1   Title Pt able to amb with a normal gait pattern and 90% less pain to allow normal community amb.    Time 6    Period Weeks    Status New    Target Date 05/14/21      PT LONG TERM GOAL #2   Title Pt able to descend stairs with a reciprocal gait and no increase in pain.    Time 6    Period Weeks    Status New      PT LONG TERM GOAL #3   Title improved right knee extension strength to 5/5    Time 6    Period Weeks    Status New      PT LONG TERM GOAL #4   Title Ind with advanced HEP its progression  Time 6    Period Weeks    Status New      PT LONG TERM GOAL #5   Title improved FS score to 66    Baseline 42    Time 6    Period Weeks    Status New                    Plan - 04/02/21 0940     Clinical Impression Statement Patient presents s/p right knee arthroscope on 02/26/21 for torn medial meniscus. She complains of immediate pain with  ambulation and has difficulty descending stairs. 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 She has full ROM, but has significant quad weakness requiring step-to gait descending stairs and affecting gait during swing and stance phases. She is also unable to do a full LAQ. She has atrophy in right calf with corresponding PF weakness. Edema is present in the right knee as well. She is scheduled to have a gel injection in the next week. She will benefit from skilled PT to address these deficits.    Personal Factors and Comorbidities Comorbidity 1    Comorbidities OA right knee/patella    Examination-Activity Limitations Locomotion Level;Stairs    Examination-Participation Restrictions Occupation    Stability/Clinical Decision Making Stable/Uncomplicated    Clinical Decision Making Low    Rehab Potential Excellent    PT Frequency 2x / week    PT Duration 6 weeks    PT Treatment/Interventions ADLs/Self Care Home Management;Aquatic Therapy;Cryotherapy;Electrical Stimulation;Moist Heat;Neuromuscular re-education;Balance training;Therapeutic exercise;Therapeutic activities;Stair training;Gait training;Patient/family education;Manual techniques;Dry needling;Taping;Vasopneumatic Device    PT Next Visit Plan Review HEP, eccentric quad strengthening, gait, stairs    PT Home Exercise Plan [email or web address]    Consulted and Agree with Plan of Care Patient             Patient will benefit from skilled therapeutic intervention in order to improve the following deficits and impairments:  Abnormal gait, Pain, Impaired flexibility, Decreased strength, Increased edema  Visit Diagnosis: Muscle weakness (generalized)  Other abnormalities of gait and mobility  Localized edema     Problem List There are no problems to display for this patient.  Solon Palm, PT 04/02/2021, 9:58 AM  [facility] [address]  [address] [place], [place], [address] Phone:  [phone]   Fax:  [phone]  Name: Shatonya Passon MRN: [id number] Date of Birth: [birth year]

## 2021-04-02 NOTE — Telephone Encounter (Signed)
VOB submitted for Monovisc, right knee BV pending  

## 2021-04-02 NOTE — Telephone Encounter (Signed)
Talked with patient concerning gel injection approval and price.  Advised patient that she will receive a call once approved.  Patient voiced that she understands.

## 2021-04-02 NOTE — Telephone Encounter (Signed)
Pt called about the gel injection for her right knee   Cb [phone]

## 2021-04-03 ENCOUNTER — Telehealth: Payer: Self-pay

## 2021-04-03 NOTE — Telephone Encounter (Signed)
Approved for Monovisc, right knee. Buy & Bill Patient will be responsible for 30% OOP. No Co-pay No PA or Pre-Determination is required per [facility] with [facility]. Reference# [id number]

## 2021-04-07 ENCOUNTER — Encounter: Payer: Self-pay | Admitting: Physical Therapy

## 2021-04-07 ENCOUNTER — Other Ambulatory Visit: Payer: Self-pay

## 2021-04-07 ENCOUNTER — Ambulatory Visit: Payer: 59 | Admitting: Physical Therapy

## 2021-04-07 DIAGNOSIS — M6281 Muscle weakness (generalized): Secondary | ICD-10-CM | POA: Diagnosis not present

## 2021-04-07 DIAGNOSIS — R2689 Other abnormalities of gait and mobility: Secondary | ICD-10-CM

## 2021-04-07 DIAGNOSIS — R600 Localized edema: Secondary | ICD-10-CM

## 2021-04-07 NOTE — Patient Instructions (Signed)
Access Code: [email or web address] URL: [email or web address] Date: 04/07/2021 Prepared by: Harrie Foreman  Exercises Standing Heel Raise - 2 x daily - 7 x weekly - 2 sets - 10 reps Supine Hamstring Stretch with Strap - 2 x daily - 7 x weekly - 1 sets - 3 reps - 60 sec hold Sidelying Hip Flexion - 2 x daily - 7 x weekly - 2-3 sets - 10 reps Seated Long Arc Quad (90-45 Degree Range) - 2 x daily - 7 x weekly - 3 sets - 10 reps Prone Hamstring Curl with Ankle Weight - 2 x daily - 7 x weekly - 2 sets - 10 reps Prone Hip Extension with Bent Knee - 2 x daily - 7 x weekly - 2 sets - 10 reps Prone Hip Extension with Plantarflexion - 2 x daily - 7 x weekly - 2 sets - 10 reps Clamshell with Resistance - 21 x daily - 7 x weekly - 2 sets - 10 reps

## 2021-04-07 NOTE — Therapy (Addendum)
PHYSICAL THERAPY DISCHARGE SUMMARY (05/07/21)  Visits from Start of Care: 2  Current functional level related to goals / functional outcomes: See note below   Remaining deficits: See note below   Education / Equipment: HEP  Plan: Patient agrees to discharge.  Patient goals were not met. Patient is being discharged due to patient request, she reports she no longer needs PT and canceled all visits on 04/28/21.    Rennie Natter, PT, DPT 05/07/21   [facility] [address]  [place], [place], [address] Phone: [phone]   Fax:  [phone]  Physical Therapy Treatment  Patient Details  Name: Marie Barr MRN: [id number] Date of Birth: [birth year] Referring Provider (PT): Erskine Emery PA-C [facility] - surgeon)   Encounter Date: 04/07/2021   PT End of Session - 04/07/21 0936     Visit Number 2    Date for PT Re-Evaluation 05/14/21    Authorization Type Aetna    PT Start Time 0932    PT Stop Time 1015    PT Time Calculation (min) 43 min    Activity Tolerance Patient tolerated treatment well    Behavior During Therapy [facility] for tasks assessed/performed             History reviewed. No pertinent past medical history.  Past Surgical History:  Procedure Laterality Date   SPINE SURGERY  11-16-10   L5-S1 discetomy    There were no vitals filed for this visit.   Subjective Assessment - 04/07/21 0933     Subjective Patient had arthroscopic surgery 02/26/21 for torn medial meniscus. She reports knee has been doing "OK", has been trying to do exercises but the leg raises are extremely painful.   She has been trying to walk more because that does make her feel better.    Pertinent History lumbar surgery 2010, arthroscope 02/26/21    How long can you sit comfortably? not limited    How long can you stand comfortably? not limited    How long can you walk comfortably? not at all    Diagnostic tests MRI -  medial meniscus tear post horn; patellofemoral deg changes/patella alta; med compartment chondrosis    Patient Stated Goals to get rid of pain and be able to walk normally again    Currently in Pain? Yes    Pain Score 2    0/10 at rest, 2/10 walking, 8/10 with leg lifts   Pain Location Knee    Pain Orientation Right    Pain Onset More than a month ago                               [facility] Adult PT Treatment/Exercise - 04/07/21 0001       Self-Care   Self-Care Scar Mobilizations    Scar Mobilizations instructed in scar tissue mobilization R knee      Exercises   Exercises Knee/Hip      Knee/Hip Exercises: Aerobic   Stationary Bike L1 x 6 min      Knee/Hip Exercises: Standing   Heel Raises Both;2 sets;15 reps    Heel Raises Limitations eccentric single leg down      Knee/Hip Exercises: Seated   Long Arc Quad Strengthening;Right;2 sets;10 reps    Long Arc Quad Limitations partial ROM in pain free ROM with ankle pump    Other Seated Knee/Hip Exercises seated quad set RLE x 10  Other Seated Knee/Hip Exercises ankle pumps in sitting      Knee/Hip Exercises: Prone   Hamstring Curl 2 sets;10 reps    Hip Extension Strengthening;Both;2 sets;10 reps    Hip Extension Limitations bent knee    Straight Leg Raises Strengthening;Both;2 sets;10 reps                          PT Long Term Goals - 04/07/21 1216       PT LONG TERM GOAL #1   Title Pt able to amb with a normal gait pattern and 90% less pain to allow normal community amb.    Time 6    Period Weeks    Status On-going      PT LONG TERM GOAL #2   Title Pt able to descend stairs with a reciprocal gait and no increase in pain.    Time 6    Period Weeks    Status On-going      PT LONG TERM GOAL #3   Title improved right knee extension strength to 5/5    Time 6    Period Weeks    Status On-going      PT LONG TERM GOAL #4   Title Ind with advanced HEP its progression    Time 6     Period Weeks    Status On-going      PT LONG TERM GOAL #5   Title improved FS score to 66    Baseline 42    Time 6    Period Weeks    Status On-going                   Plan - 04/07/21 1213     Clinical Impression Statement Patient reports overall improvement in mobility and walking, but single leg raises are extremely painful.  Focus of today's session was reviewing, modifying and progressing initial HEP as approprirate.  SLR were discontinued, and glut strengthening exercises were added.  At end of session she reported no knee pain.  Noted some trigger points in her R quad, tib Ant and peroneals, she is open to dry needling and will consider next session if continues to report tightness/pulling around her knee cap.  She would benefit from continued skilled therapy.    Personal Factors and Comorbidities Comorbidity 1    Comorbidities OA right knee/patella    Examination-Activity Limitations Locomotion Level;Stairs    Examination-Participation Restrictions Occupation    Stability/Clinical Decision Making Stable/Uncomplicated    Rehab Potential Excellent    PT Frequency 2x / week    PT Duration 6 weeks    PT Treatment/Interventions ADLs/Self Care Home Management;Aquatic Therapy;Cryotherapy;Electrical Stimulation;Moist Heat;Neuromuscular re-education;Balance training;Therapeutic exercise;Therapeutic activities;Stair training;Gait training;Patient/family education;Manual techniques;Dry needling;Taping;Vasopneumatic Device    PT Next Visit Plan Review HEP, eccentric quad strengthening, gait, stairs    PT Home Exercise Plan [email or web address]    Consulted and Agree with Plan of Care Patient             Patient will benefit from skilled therapeutic intervention in order to improve the following deficits and impairments:  Abnormal gait, Pain, Impaired flexibility, Decreased strength, Increased edema  Visit Diagnosis: Muscle weakness (generalized)  Other abnormalities of gait and  mobility  Localized edema     Problem List There are no problems to display for this patient.   Rennie Natter, PT, DPT 04/07/2021, 12:17 PM  [facility] [address]  [place], [place], [address] Phone: [phone]   Fax:  [phone]  Name: Marie Barr MRN: [id number] Date of Birth: [birth year]

## 2021-04-13 ENCOUNTER — Encounter: Payer: Self-pay | Admitting: Physician Assistant

## 2021-04-13 ENCOUNTER — Other Ambulatory Visit: Payer: Self-pay

## 2021-04-13 ENCOUNTER — Ambulatory Visit (INDEPENDENT_AMBULATORY_CARE_PROVIDER_SITE_OTHER): Payer: 59 | Admitting: Physician Assistant

## 2021-04-13 DIAGNOSIS — M1711 Unilateral primary osteoarthritis, right knee: Secondary | ICD-10-CM

## 2021-04-13 MED ORDER — HYALURONAN 88 MG/4ML IX SOSY
88.0000 mg | PREFILLED_SYRINGE | INTRA_ARTICULAR | Status: AC | PRN
  Administered 2021-04-13: 88 mg via INTRA_ARTICULAR

## 2021-04-13 MED ORDER — LIDOCAINE HCL 1 % IJ SOLN
3.0000 mL | INTRAMUSCULAR | Status: AC | PRN
  Administered 2021-04-13: 3 mL

## 2021-04-13 NOTE — Progress Notes (Signed)
   Procedure Note  Patient: Marie Barr             Date of Birth: July 16, 1975           MRN: [id number]             Visit Date: 04/13/2021  HPI: Marie Barr returns today for right knee Monovisc injection.  She states overall the knee is improved with therapy.  She has rare giving way of the knee.  Some swelling.  No new injury.  Again she underwent knee arthroscopy which she was found to have significant cartilage wear medial compartment of the knee and patellofemoral joint.  There is also meniscal root tear.  She has no planned surgery next 6 months.  Physical exam: Right knee good range of motion.  Port sites well-healed.  Positive effusion.  No abnormal warmth or erythema.   Procedures: Visit Diagnoses:  1. Primary osteoarthritis of right knee     Large Joint Inj: R knee on 04/13/2021 3:57 PM Indications: pain Details: 22 G 1.5 in needle, superolateral approach  Arthrogram: No  Medications: 88 mg Hyaluronan 88 MG/4ML; 3 mL lidocaine 1 % Aspirate: 18 mL yellow Outcome: tolerated well, no immediate complications Procedure, treatment alternatives, risks and benefits explained, specific risks discussed. Consent was given by the patient. Immediately prior to procedure a time out was called to verify the correct patient, procedure, equipment, support staff and site/side marked as required. Patient was prepped and draped in the usual sterile fashion.    Plan: She understands to wait at least 6 months between supplemental injections.  We will see her back in 8 weeks to see what type of response she had to the injection.  Questions were encouraged and answered at length.

## 2021-04-14 ENCOUNTER — Encounter: Payer: 59 | Admitting: Physical Therapy

## 2021-04-20 ENCOUNTER — Ambulatory Visit: Payer: 59 | Admitting: Physical Therapy

## 2021-04-23 ENCOUNTER — Ambulatory Visit: Payer: 59 | Attending: Physician Assistant

## 2021-04-28 ENCOUNTER — Ambulatory Visit: Payer: 59 | Admitting: Physical Therapy

## 2021-05-06 ENCOUNTER — Ambulatory Visit
Admission: RE | Admit: 2021-05-06 | Discharge: 2021-05-06 | Disposition: A | Discharge status: Home / Self Care | Payer: No Typology Code available for payment source | Source: Ambulatory Visit | Attending: Family Medicine | Admitting: Family Medicine

## 2021-05-06 DIAGNOSIS — Z1231 Encounter for screening mammogram for malignant neoplasm of breast: Secondary | ICD-10-CM

## 2021-05-07 ENCOUNTER — Encounter: Payer: 59 | Admitting: Physical Therapy

## 2021-05-11 ENCOUNTER — Other Ambulatory Visit: Payer: Self-pay | Admitting: Family Medicine

## 2021-05-11 ENCOUNTER — Other Ambulatory Visit (HOSPITAL_COMMUNITY)
Admission: RE | Admit: 2021-05-11 | Discharge: 2021-05-11 | Disposition: A | Discharge status: Home / Self Care | Payer: No Typology Code available for payment source | Source: Ambulatory Visit | Attending: Family Medicine | Admitting: Family Medicine

## 2021-05-11 DIAGNOSIS — Z01411 Encounter for gynecological examination (general) (routine) with abnormal findings: Secondary | ICD-10-CM | POA: Insufficient documentation

## 2021-05-15 LAB — CYTOLOGY - PAP
Comment: NEGATIVE
Comment: NEGATIVE
HPV 16: NEGATIVE
HPV 18 / 45: NEGATIVE
High risk HPV: POSITIVE — AB

## 2021-06-08 ENCOUNTER — Ambulatory Visit (INDEPENDENT_AMBULATORY_CARE_PROVIDER_SITE_OTHER): Payer: 59 | Admitting: Orthopaedic Surgery

## 2021-06-08 ENCOUNTER — Encounter: Payer: Self-pay | Admitting: Orthopaedic Surgery

## 2021-06-08 ENCOUNTER — Other Ambulatory Visit: Payer: Self-pay

## 2021-06-08 DIAGNOSIS — Z9889 Other specified postprocedural states: Secondary | ICD-10-CM

## 2021-06-08 DIAGNOSIS — M1711 Unilateral primary osteoarthritis, right knee: Secondary | ICD-10-CM

## 2021-06-08 NOTE — Progress Notes (Signed)
The patient is following up after having an arthroscopic intervention back in October for her right knee with a partial medial vasectomy but we found significant cartilage wear in the medial compartment of the knee and the patellofemoral joint.  She did have a hyaluronic acid injection in November and she says now the knee pain has gone with the right knee.  She still has some occasional swelling in the knee and she says her right knee will occasionally give out on her.  She did go through a few physical therapy sessions and does admit that she has not taken what they have shown her for home exercise program.  On exam the knee shows just a slight effusion of the right side with excellent range of motion and it feels ligamentously stable.  I recommend that she work on quad strengthening exercises and consider some type of knee sleeve in the future.  All question concerns were answered addressed.  Follow-up can be really as needed since she is doing well.  If things worsen she also knows to let us know.

## 2021-06-17 ENCOUNTER — Other Ambulatory Visit: Payer: Self-pay | Admitting: Obstetrics and Gynecology

## 2021-12-22 ENCOUNTER — Other Ambulatory Visit: Payer: Self-pay

## 2021-12-22 ENCOUNTER — Emergency Department (HOSPITAL_COMMUNITY)
Admission: EM | Admit: 2021-12-22 | Discharge: 2021-12-22 | Disposition: A | Discharge status: Home / Self Care | Payer: No Typology Code available for payment source | Attending: Emergency Medicine | Admitting: Emergency Medicine

## 2021-12-22 ENCOUNTER — Emergency Department (HOSPITAL_COMMUNITY): Payer: No Typology Code available for payment source

## 2021-12-22 ENCOUNTER — Encounter (HOSPITAL_COMMUNITY): Payer: Self-pay

## 2021-12-22 DIAGNOSIS — S92511A Displaced fracture of proximal phalanx of right lesser toe(s), initial encounter for closed fracture: Secondary | ICD-10-CM | POA: Diagnosis not present

## 2021-12-22 DIAGNOSIS — W228XXA Striking against or struck by other objects, initial encounter: Secondary | ICD-10-CM | POA: Diagnosis not present

## 2021-12-22 DIAGNOSIS — S99921A Unspecified injury of right foot, initial encounter: Secondary | ICD-10-CM | POA: Diagnosis present

## 2021-12-22 NOTE — ED Notes (Signed)
I provided reinforced discharge education based off of after visit summary/care provided. Pt acknowledged and understood my education. Pt had no further questions/concerns for provider/myself. After visit summary provided to pt. 

## 2021-12-22 NOTE — Discharge Instructions (Signed)
It was a pleasure taking care of you today!   Your x-ray showed fracture of the right little toe.  Attached is information for the on-call orthopedist, call tomorrow and set up a follow-up appoint regarding today's ED visit.  Wear the boot during the day, you may weight-bear as tolerated.  You may remove the boot at night.  You can take over-the-counter 600 mg ibuprofen every 6 hours or 500 mg Tylenol every 6 hours.  Place ice to the affected area for up to 15 minutes at a time, ensure to place a barrier between your skin and the ice.  There will be tape placed to the toe today, you may replace the tape daily.  You may follow-up with your primary care provider as needed.  Return to the emergency department for experiencing increasing/worsening pain, swelling, fever, worsening symptoms.

## 2021-12-22 NOTE — ED Triage Notes (Signed)
Patient states she has displaced her fifth toe on the right foot. Patient went to urgent care where they confirmed displacement. Patient would like follow up care for her displaced toe.

## 2021-12-22 NOTE — ED Provider Notes (Signed)
[facility]-EMERGENCY DEPT Provider Note   CSN: [id number] Arrival date & time: 12/22/21  1826     History  Chief Complaint  Patient presents with   Toe Injury    Marie Barr is a 46 y.o. female who presents to the emergency department with concerns for right toe pain onset today.  Notes that she stubbed her toe on the couch prior to arrival.  Notes that she was evaluated in urgent care with an x-ray completed and they informed her that her toe was broken as well as displaced.  Also informed her to follow-up with [facility].  They provided her with a boot.  Patient called [facility] who noted that they do not have an appointment until the end of the week.  Patient is flying out to [place].  No medications tried prior to arrival.  Denies fever, drainage.  The history is provided by the patient. No language interpreter was used.       Home Medications Prior to Admission medications   Medication Sig Start Date End Date Taking? Authorizing Provider  etonogestrel-ethinyl estradiol (NUVARING) 0.12-0.015 MG/24HR vaginal ring Insert vaginally and leave in place for 3 consecutive weeks, then remove for 1 week. 01/05/13 01/05/14  Burchette, Elberta Fortis, MD  HYDROcodone-acetaminophen (NORCO/VICODIN) 5-325 MG tablet Take 1-2 tablets by mouth every 6 (six) hours as needed for moderate pain. Patient not taking: Reported on 04/02/2021 02/26/21   Kathryne Hitch, MD  levothyroxine (SYNTHROID) 175 MCG tablet Take 175 mcg by mouth every morning. 12/30/20   [provider]  meloxicam (MOBIC) 15 MG tablet Take 1 tablet (15 mg total) by mouth daily. Take with food Patient not taking: Reported on 04/02/2021 08/04/17   Tyrell Antonio, MD  methocarbamol (ROBAXIN) 500 MG tablet Take 1 tablet (500 mg total) by mouth every 8 (eight) hours as needed for muscle spasms. Patient not taking: Reported on 04/02/2021 06/15/17   Cammy Copa, MD  tiZANidine (ZANAFLEX)  4 MG tablet Take 1 tablet (4 mg total) by mouth every 8 (eight) hours as needed for muscle spasms. Patient not taking: Reported on 04/02/2021 03/05/21   Kathryne Hitch, MD      Allergies    Patient has no known allergies.    Review of Systems   Review of Systems  Constitutional:  Negative for fever.  Musculoskeletal:  Positive for arthralgias. Negative for gait problem and joint swelling.  Skin:  Negative for color change, rash and wound.  All other systems reviewed and are negative.   Physical Exam Updated Vital Signs BP 119/83 (BP Location: Left Arm)   Pulse 81   Temp 98.3 F (36.8 C) (Oral)   Resp 18   Ht 5\' 10"  (1.778 m)   Wt 93 kg   SpO2 100%   BMI 29.41 kg/m  Physical Exam Vitals and nursing note reviewed.  Constitutional:      General: She is not in acute distress.    Appearance: Normal appearance.  Eyes:     General: No scleral icterus.    Extraocular Movements: Extraocular movements intact.  Cardiovascular:     Rate and Rhythm: Normal rate.  Pulmonary:     Effort: Pulmonary effort is normal. No respiratory distress.  Musculoskeletal:     Cervical back: Neck supple.     Comments: No tenderness to palpation to right fifth toe.  Right fifth toe appears laterally displaced.  No obvious erythema or swelling noted to the area.  Pedal pulses intact  bilaterally. Patient able to ambulate without assistance or difficulty.    Skin:    General: Skin is warm and dry.     Findings: No bruising, erythema or rash.  Neurological:     Mental Status: She is alert.  Psychiatric:        Behavior: Behavior normal.     ED Results / Procedures / Treatments   Labs (all labs ordered are listed, but only abnormal results are displayed) Labs Reviewed - No data to display  EKG None  Radiology DG Foot Complete Right  Result Date: 12/22/2021 CLINICAL DATA:  Displaced fifth digit EXAM: RIGHT FOOT COMPLETE - 3+ VIEW COMPARISON:  None Available. FINDINGS: Acute oblique  fracture of the midshaft proximal phalanx of the fifth toe. Overriding of the fracture fragments with mild displacement. Overlying soft tissue swelling. No additional fractures are identified. Joint spaces are normal. IMPRESSION: Oblique fracture of the midshaft proximal phalanx of the fifth toe. Electronically Signed   By: Lucienne Capers M.D.   On: 12/22/2021 19:55    Procedures Procedures    Medications Ordered in ED Medications - No data to display  ED Course/ Medical Decision Making/ A&P Clinical Course as of 12/22/21 2310  Tue Dec 22, 2021  2006 Discussion with patient regarding imaging findings and discharge treatment plan.  Answered all available questions.  Offered patient a short course of narcotics to which patient declined at this time.  Patient appears safe for discharge at this time. [SB]    Clinical Course User Index [SB] Ellawyn Wogan A, PA-C                           Medical Decision Making Amount and/or Complexity of Data Reviewed Radiology: ordered.   Pt presents with concerns for right fifth toe injury onset prior to arrival.  Patient notes that she stubbed her toe.  Was evaluated urgent care and told to come to the ED for further evaluation of her symptoms. Vital signs, patient afebrile. On exam, pt with No tenderness to palpation to right fifth toe.  Right fifth toe appears laterally displaced.  No obvious erythema or swelling noted to the area.  Pedal pulses intact bilaterally. Patient able to ambulate without assistance or difficulty.  Differential diagnosis includes fracture, dislocation, contusion.   Imaging: I ordered imaging studies including right foot xray I independently visualized and interpreted imaging which showed: Oblique fracture of the midshaft proximal phalanx of the right fifth toe I agree with the radiologist interpretation   Disposition: Presentation suspicious for fracture.  Doubt contusion at this time.  Patient already with a cam walker  boot as per urgent care.  Offered crutches at this time to which patient declined at this time.  After consideration of the diagnostic results and the patients response to treatment, I feel that the patient would benefit from Discharge home.  Offered to send patient home with a short course of narcotics, patient declined at this time.  Information for on-call orthopedist provided to patient today for follow-up.  Supportive care measures and strict return precautions discussed with patient at bedside. Pt acknowledges and verbalizes understanding. Pt appears safe for discharge. Follow up as indicated in discharge paperwork.    This chart was dictated using voice recognition software, Dragon. Despite the best efforts of this provider to proofread and correct errors, errors may still occur which can change documentation meaning.   Final Clinical Impression(s) / ED Diagnoses Final diagnoses:  Closed  displaced fracture of proximal phalanx of lesser toe of right foot, initial encounter    Rx / DC Orders ED Discharge Orders     None         Linnet Bottari A, PA-C 12/22/21 2310    Mancel Bale, MD 12/24/21 1131

## 2022-06-16 ENCOUNTER — Other Ambulatory Visit: Payer: Self-pay | Admitting: Family Medicine

## 2022-06-16 DIAGNOSIS — Z1231 Encounter for screening mammogram for malignant neoplasm of breast: Secondary | ICD-10-CM

## 2022-06-18 ENCOUNTER — Ambulatory Visit
Admission: RE | Admit: 2022-06-18 | Discharge: 2022-06-18 | Disposition: A | Discharge status: Home / Self Care | Source: Ambulatory Visit | Attending: Family Medicine | Admitting: Family Medicine

## 2022-06-18 DIAGNOSIS — Z1231 Encounter for screening mammogram for malignant neoplasm of breast: Secondary | ICD-10-CM

## 2023-03-09 ENCOUNTER — Ambulatory Visit: Payer: Non-veteran care | Admitting: Orthopedic Surgery

## 2023-06-01 ENCOUNTER — Other Ambulatory Visit: Payer: Self-pay | Admitting: Family Medicine

## 2023-06-01 DIAGNOSIS — Z1231 Encounter for screening mammogram for malignant neoplasm of breast: Secondary | ICD-10-CM

## 2023-06-22 ENCOUNTER — Ambulatory Visit
Admission: RE | Admit: 2023-06-22 | Discharge: 2023-06-22 | Disposition: A | Discharge status: Home / Self Care | Source: Ambulatory Visit | Attending: Family Medicine | Admitting: Family Medicine

## 2023-06-22 DIAGNOSIS — Z1231 Encounter for screening mammogram for malignant neoplasm of breast: Secondary | ICD-10-CM

## 2024-03-08 ENCOUNTER — Encounter: Payer: Self-pay | Admitting: Podiatry

## 2024-03-08 ENCOUNTER — Ambulatory Visit (INDEPENDENT_AMBULATORY_CARE_PROVIDER_SITE_OTHER): Admitting: Podiatry

## 2024-03-08 VITALS — Ht 70.0 in | Wt 205.0 lb

## 2024-03-08 DIAGNOSIS — S90219A Contusion of unspecified great toe with damage to nail, initial encounter: Secondary | ICD-10-CM

## 2024-03-08 DIAGNOSIS — L600 Ingrowing nail: Secondary | ICD-10-CM

## 2024-03-08 NOTE — Progress Notes (Signed)
 Subjective:   Patient ID: Marie Barr, female   DOB: 48 y.o.   MRN: [id number]   HPI Patient presents stating she is very concerned about her big toenails stating that they have had discoloration for years but they have developed a dark like appearance recently.  States this has been going on now for around 4 months is not tender but wants it taken care of.  Patient does not smoke likes to be active   Review of Systems  All other systems reviewed and are negative.       Objective:  Physical Exam Vitals and nursing note reviewed.  Constitutional:      Appearance: She is well-developed.  Pulmonary:     Effort: Pulmonary effort is normal.  Musculoskeletal:        General: Normal range of motion.  Skin:    General: Skin is warm.  Neurological:     Mental Status: She is alert.     Neurovascular status intact muscle strength adequate range of motion is adequate with patient found to have discoloration within the hallux nailbed bilateral distal lifting the left minimal discomfort no drainage.  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  I did discuss medications I discussed possible nail removal in future but at this point we are gena hold off and treat conservatively.  All questions answered today and we are going to watch these and again I did educate her on nail removal that it would be permanent
